# Patient Record
Sex: Female | Born: 1950 | Race: White | Hispanic: No | Marital: Married | Smoking: Former smoker
Health system: Southern US, Community
[De-identification: ages and names within clinical notes are randomized; demographics above are authoritative.]

## PROBLEM LIST (undated history)

## (undated) DIAGNOSIS — M48061 Spinal stenosis, lumbar region without neurogenic claudication: Secondary | ICD-10-CM

## (undated) DIAGNOSIS — M5416 Radiculopathy, lumbar region: Secondary | ICD-10-CM

## (undated) DIAGNOSIS — M5126 Other intervertebral disc displacement, lumbar region: Secondary | ICD-10-CM

## (undated) DIAGNOSIS — M549 Dorsalgia, unspecified: Secondary | ICD-10-CM

## (undated) DIAGNOSIS — M419 Scoliosis, unspecified: Secondary | ICD-10-CM

## (undated) DIAGNOSIS — M79606 Pain in leg, unspecified: Secondary | ICD-10-CM

## (undated) DIAGNOSIS — I499 Cardiac arrhythmia, unspecified: Secondary | ICD-10-CM

## (undated) DIAGNOSIS — I10 Essential (primary) hypertension: Secondary | ICD-10-CM

## (undated) DIAGNOSIS — M8580 Other specified disorders of bone density and structure, unspecified site: Secondary | ICD-10-CM

## (undated) DIAGNOSIS — R29898 Other symptoms and signs involving the musculoskeletal system: Secondary | ICD-10-CM

## (undated) DIAGNOSIS — R000 Tachycardia, unspecified: Secondary | ICD-10-CM

## (undated) HISTORY — DX: Essential (primary) hypertension: I10

## (undated) HISTORY — PX: BILATERAL CARPAL TUNNEL RELEASE: SHX6508

## (undated) HISTORY — DX: Tachycardia, unspecified: R00.0

## (undated) HISTORY — DX: Spinal stenosis, lumbar region without neurogenic claudication: M48.061

## (undated) HISTORY — DX: Radiculopathy, lumbar region: M54.16

## (undated) HISTORY — DX: Other specified disorders of bone density and structure, unspecified site: M85.80

## (undated) HISTORY — DX: Pain in leg, unspecified: M79.606

## (undated) HISTORY — DX: Dorsalgia, unspecified: M54.9

## (undated) HISTORY — DX: Other intervertebral disc displacement, lumbar region: M51.26

## (undated) HISTORY — DX: Other symptoms and signs involving the musculoskeletal system: R29.898

## (undated) HISTORY — DX: Scoliosis, unspecified: M41.9

## (undated) HISTORY — PX: NECK SURGERY: SHX720

---

## 2001-08-18 HISTORY — PX: KNEE SURGERY: SHX244

## 2002-08-18 HISTORY — PX: OTHER SURGICAL HISTORY: SHX169

## 2015-08-19 HISTORY — PX: APPENDECTOMY: SHX54

## 2019-07-19 HISTORY — PX: BACK SURGERY: SHX140

## 2019-09-14 ENCOUNTER — Other Ambulatory Visit: Payer: Self-pay

## 2019-09-14 ENCOUNTER — Other Ambulatory Visit: Payer: Self-pay | Admitting: Neurosurgery

## 2019-09-16 ENCOUNTER — Encounter: Payer: Self-pay | Admitting: Vascular Surgery

## 2019-10-07 ENCOUNTER — Telehealth (HOSPITAL_COMMUNITY): Payer: Self-pay

## 2019-10-07 NOTE — Telephone Encounter (Signed)

## 2019-10-10 ENCOUNTER — Ambulatory Visit (INDEPENDENT_AMBULATORY_CARE_PROVIDER_SITE_OTHER): Payer: Medicare Other | Admitting: Surgery

## 2019-10-10 ENCOUNTER — Other Ambulatory Visit: Payer: Self-pay

## 2019-10-10 ENCOUNTER — Encounter: Payer: Self-pay | Admitting: Surgery

## 2019-10-10 VITALS — BP 128/82 | HR 73 | Temp 97.9°F | Resp 20 | Ht 66.5 in | Wt 114.0 lb

## 2019-10-10 DIAGNOSIS — M479 Spondylosis, unspecified: Secondary | ICD-10-CM | POA: Diagnosis not present

## 2019-10-10 NOTE — H&P (View-Only) (Signed)
[facility]  Patient name: Meagan Garrett MRN: [id number] DOB: 1950-12-08 Sex: female   REQUESTING PROVIDER:    Dr. Venetia Maxon   REASON FOR CONSULT:    ALIF L5-S1  HISTORY OF PRESENT ILLNESS:   Meagan Garrett is a 69 y.o. female, who is referred for evaluation of anterior exposure at the L5-S1 disc base.  The patient has been having back pain since June 2020.  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  Her symptoms have gotten worse.  She also has an associated foot drop on the left.  The patient has had an appendectomy and some form of tubal procedure about 20 years ago.  Those are her only abdominal surgeries.  She has never smoked.  PAST MEDICAL HISTORY    Past Medical History:  Diagnosis Date  . Back pain   . Back pain   . Degenerative lumbar spinal stenosis   . Disc displacement, lumbar   . Hypertension   . Leg pain   . Leg weakness   . Lumbar radiculopathy   . Lumbar scoliosis   . Osteopenia   . Rapid heart rate      FAMILY HISTORY   History reviewed. No pertinent family history.  SOCIAL HISTORY:   Social History   Socioeconomic History  . Marital status: Married    Spouse name: Not on file  . Number of children: Not on file  . Years of education: Not on file  . Highest education level: Not on file  Occupational History  . Occupation: Retired  Tobacco Use  . Smoking status: Never Smoker  . Smokeless tobacco: Never Used  Substance and Sexual Activity  . Alcohol use: Yes    Comment: Occasionally  . Drug use: Yes    Types: Hydrocodone  . Sexual activity: Not on file  Other Topics Concern  . Not on file  Social History Narrative  . Not on file   Social Determinants of Health   Financial Resource Strain:   . Difficulty of Paying Living Expenses: Not on file  Food Insecurity:   . Worried About Programme researcher, broadcasting/film/video in the Last Year: Not on file  . Ran Out of Food in the Last Year:  Not on file  Transportation Needs:   . Lack of Transportation (Medical): Not on file  . Lack of Transportation (Non-Medical): Not on file  Physical Activity:   . Days of Exercise per Week: Not on file  . Minutes of Exercise per Session: Not on file  Stress:   . Feeling of Stress : Not on file  Social Connections:   . Frequency of Communication with Friends and Family: Not on file  . Frequency of Social Gatherings with Friends and Family: Not on file  . Attends Religious Services: Not on file  . Active Member of Clubs or Organizations: Not on file  . Attends Banker Meetings: Not on file  . Marital Status: Not on file  Intimate Partner Violence:   . Fear of Current or Ex-Partner: Not on file  . Emotionally Abused: Not on file  . Physically Abused: Not on file  . Sexually Abused: Not on file    ALLERGIES:    No Known Allergies  CURRENT MEDICATIONS:    Current Outpatient Medications  Medication Sig Dispense Refill  . gabapentin (NEURONTIN) 300 MG capsule Take 300 mg by mouth 3 (three) times daily.    . hydrochlorothiazide (MICROZIDE) 12.5 MG capsule Take  12.5 mg by mouth daily. Patient takes as needed    . HYDROcodone-acetaminophen (NORCO) 10-325 MG tablet     . metoprolol tartrate (LOPRESSOR) 25 MG tablet     . potassium chloride (KLOR-CON) 10 MEQ tablet Take 10 mEq by mouth daily.     No current facility-administered medications for this visit.    REVIEW OF SYSTEMS:   [X]  denotes positive finding, [ ]  denotes negative finding Cardiac  Comments:  Chest pain or chest pressure:    Shortness of breath upon exertion:    Short of breath when lying flat:    Irregular heart rhythm:        Vascular    Pain in calf, thigh, or hip brought on by ambulation:    Pain in feet at night that wakes you up from your sleep:     Blood clot in your veins:    Leg swelling:         Pulmonary    Oxygen at home:    Productive cough:     Wheezing:         Neurologic      Sudden weakness in arms or legs:     Sudden numbness in arms or legs:     Sudden onset of difficulty speaking or slurred speech:    Temporary loss of vision in one eye:     Problems with dizziness:         Gastrointestinal    Blood in stool:      Vomited blood:         Genitourinary    Burning when urinating:     Blood in urine:        Psychiatric    Major depression:         Hematologic    Bleeding problems:    Problems with blood clotting too easily:        Skin    Rashes or ulcers:        Constitutional    Fever or chills:     PHYSICAL EXAM:   There were no vitals filed for this visit.  GENERAL: The patient is a well-nourished female, in no acute distress. The vital signs are documented above. CARDIAC: There is a regular rate and rhythm.  VASCULAR: Palpable dorsalis pedis pulse  PULMONARY: Nonlabored respirations ABDOMEN: Soft and non-tender  MUSCULOSKELETAL: There are no major deformities or cyanosis. NEUROLOGIC: No focal weakness or paresthesias are detected. SKIN: There are no ulcers or rashes noted. PSYCHIATRIC: The patient has a normal affect.  STUDIES:   I have reviewed her outside x-rays including her MRI of the lower back.  ASSESSMENT and PLAN   Degenerative disc disease: I discussed the details of an anterior approach to the L5-S1 disc space.  We discussed the potential risk to the iliac artery and vein as well as the ureter.  We talked about wound healing and incisional issues as well as hernia.  All of her questions were answered.   Leia Alf, MD, FACS [facility] [phone] Pager [phone]

## 2019-10-10 NOTE — Progress Notes (Signed)
[facility]  Patient name: Meagan Garrett MRN: [id number] DOB: 1950-12-08 Sex: female   REQUESTING PROVIDER:    Dr. Venetia Maxon   REASON FOR CONSULT:    ALIF L5-S1  HISTORY OF PRESENT ILLNESS:   Meagan Garrett is a 69 y.o. female, who is referred for evaluation of anterior exposure at the L5-S1 disc base.  The patient has been having back pain since June 2020.  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  Her symptoms have gotten worse.  She also has an associated foot drop on the left.  The patient has had an appendectomy and some form of tubal procedure about 20 years ago.  Those are her only abdominal surgeries.  She has never smoked.  PAST MEDICAL HISTORY    Past Medical History:  Diagnosis Date  . Back pain   . Back pain   . Degenerative lumbar spinal stenosis   . Disc displacement, lumbar   . Hypertension   . Leg pain   . Leg weakness   . Lumbar radiculopathy   . Lumbar scoliosis   . Osteopenia   . Rapid heart rate      FAMILY HISTORY   History reviewed. No pertinent family history.  SOCIAL HISTORY:   Social History   Socioeconomic History  . Marital status: Married    Spouse name: Not on file  . Number of children: Not on file  . Years of education: Not on file  . Highest education level: Not on file  Occupational History  . Occupation: Retired  Tobacco Use  . Smoking status: Never Smoker  . Smokeless tobacco: Never Used  Substance and Sexual Activity  . Alcohol use: Yes    Comment: Occasionally  . Drug use: Yes    Types: Hydrocodone  . Sexual activity: Not on file  Other Topics Concern  . Not on file  Social History Narrative  . Not on file   Social Determinants of Health   Financial Resource Strain:   . Difficulty of Paying Living Expenses: Not on file  Food Insecurity:   . Worried About Programme researcher, broadcasting/film/video in the Last Year: Not on file  . Ran Out of Food in the Last Year:  Not on file  Transportation Needs:   . Lack of Transportation (Medical): Not on file  . Lack of Transportation (Non-Medical): Not on file  Physical Activity:   . Days of Exercise per Week: Not on file  . Minutes of Exercise per Session: Not on file  Stress:   . Feeling of Stress : Not on file  Social Connections:   . Frequency of Communication with Friends and Family: Not on file  . Frequency of Social Gatherings with Friends and Family: Not on file  . Attends Religious Services: Not on file  . Active Member of Clubs or Organizations: Not on file  . Attends Banker Meetings: Not on file  . Marital Status: Not on file  Intimate Partner Violence:   . Fear of Current or Ex-Partner: Not on file  . Emotionally Abused: Not on file  . Physically Abused: Not on file  . Sexually Abused: Not on file    ALLERGIES:    No Known Allergies  CURRENT MEDICATIONS:    Current Outpatient Medications  Medication Sig Dispense Refill  . gabapentin (NEURONTIN) 300 MG capsule Take 300 mg by mouth 3 (three) times daily.    . hydrochlorothiazide (MICROZIDE) 12.5 MG capsule Take  12.5 mg by mouth daily. Patient takes as needed    . HYDROcodone-acetaminophen (NORCO) 10-325 MG tablet     . metoprolol tartrate (LOPRESSOR) 25 MG tablet     . potassium chloride (KLOR-CON) 10 MEQ tablet Take 10 mEq by mouth daily.     No current facility-administered medications for this visit.    REVIEW OF SYSTEMS:   [X] denotes positive finding, [ ] denotes negative finding Cardiac  Comments:  Chest pain or chest pressure:    Shortness of breath upon exertion:    Short of breath when lying flat:    Irregular heart rhythm:        Vascular    Pain in calf, thigh, or hip brought on by ambulation:    Pain in feet at night that wakes you up from your sleep:     Blood clot in your veins:    Leg swelling:         Pulmonary    Oxygen at home:    Productive cough:     Wheezing:         Neurologic      Sudden weakness in arms or legs:     Sudden numbness in arms or legs:     Sudden onset of difficulty speaking or slurred speech:    Temporary loss of vision in one eye:     Problems with dizziness:         Gastrointestinal    Blood in stool:      Vomited blood:         Genitourinary    Burning when urinating:     Blood in urine:        Psychiatric    Major depression:         Hematologic    Bleeding problems:    Problems with blood clotting too easily:        Skin    Rashes or ulcers:        Constitutional    Fever or chills:     PHYSICAL EXAM:   There were no vitals filed for this visit.  GENERAL: The patient is a well-nourished female, in no acute distress. The vital signs are documented above. CARDIAC: There is a regular rate and rhythm.  VASCULAR: Palpable dorsalis pedis pulse  PULMONARY: Nonlabored respirations ABDOMEN: Soft and non-tender  MUSCULOSKELETAL: There are no major deformities or cyanosis. NEUROLOGIC: No focal weakness or paresthesias are detected. SKIN: There are no ulcers or rashes noted. PSYCHIATRIC: The patient has a normal affect.  STUDIES:   I have reviewed her outside x-rays including her MRI of the lower back.  ASSESSMENT and PLAN   Degenerative disc disease: I discussed the details of an anterior approach to the L5-S1 disc space.  We discussed the potential risk to the iliac artery and vein as well as the ureter.  We talked about wound healing and incisional issues as well as hernia.  All of her questions were answered.   Wells Brabham, IV, MD, FACS [facility] Tel [phone] Pager [phone] 

## 2019-10-12 NOTE — H&P (Signed)
Patient ID:   [id number] Patient: Meagan Garrett  Date of Birth: Sep 28, 1950 Visit Type: Office Visit   Date: 09/12/2019 11:15 AM Provider: Danae Orleans. Venetia Maxon MD   This 69 year old female presents for back pain.  HISTORY OF PRESENT ILLNESS:  1.  back pain  Patient returns to discuss surgical options.  The patient is currently complaining of 10/10 pain.  I reviewed her imaging with her and her husband and we discussed options of performing decompression and fusion at the L4-5 level versus correcting her lumbar scoliosis more definitively.  I am concerned that an operation at L4-5 will not completely solved her problem and she will still issues and I have therefore encouraged her to have a more definitive surgical treatment performed.  Based on an [place] of her imaging and severity of her curvature with a 15 degree coronal curve and apex at L1-2, I have recommended L5-S1 ALIF, right sided approach with XL IF L1-2, L2-3, L3-4 L4-5 levels with percutaneous pedicle screw fixation L1 through S1 levels.  This will need to be done on expedited basis because of the patient's persistent severe pain.         Medical/Surgical/Interim History Reviewed, no change.     PAST MEDICAL HISTORY, SURGICAL HISTORY, FAMILY HISTORY, SOCIAL HISTORY AND REVIEW OF SYSTEMS I have reviewed the patient's past medical, surgical, family and social history as well as the comprehensive review of systems as included on the [facility] history form dated 07/13/2019, which I have signed.  Family History:  Reviewed, no changes.    Social History: Reviewed, no changes.   MEDICATIONS: (added, continued or stopped this visit) Started Medication Directions Instruction Stopped  08/16/2019 gabapentin 300 mg capsule take 1 capsule by oral route  TID    08/25/2019 hydrocodone 10 mg-acetaminophen 325 mg tablet take 1 tablet by oral route  every 4 - 6 hours as needed for pain  09/12/2019   09/12/2019 hydrocodone 10 mg-acetaminophen 325 mg tablet take 1 tablet by oral route  every 4 - 6 hours as needed for pain    08/15/2019 Medrol (Pak) 4 mg tablets in a dose pack take by Oral route as directed    07/19/2019 methocarbamol 500 mg tablet take 1 tablet by oral route 3 times every day as needed     metoprolol tartrate 25 mg tablet     08/25/2019 Norco 5 mg-325 mg tablet take 1 tablet by oral route  every 6 hours as needed for pain     potassium chloride ER 10 mEq tablet,extended release        ALLERGIES: Ingredient Reaction Medication Name Comment  NO KNOWN ALLERGIES     No known allergies. Reviewed, no changes.    PHYSICAL EXAM:   Vitals Date Temp F BP Pulse Ht In Wt Lb BMI BSA Pain Score  09/12/2019 98.2 111/72 76 78.5 115 13.12  10/10      IMPRESSION:   Patient is having severe persistent pain.  She is limited in terms activities and ability to stand and walk for any length of time.   I have recommended proceeding with surgical correction of her spinal deformity.  PLAN:  L5-S1 ALIF, L1-2, L2-3, L3-4, L4-5 X lift with percutaneous pedicle screw fixation L1 through S1 levels.  Risks and benefits were discussed in detail with patient and she wishes to proceed with surgery.  She was fitted for a lumbosacral orthosis.  She is given a refill of narcotic analgesics.  Orders: Diagnostic  Procedures: Assessment Procedure  M54.16 Lumbar Spine- AP/Lat  Instruction(s)/Education: Assessment Instruction  Z68.1 Dietary management education, guidance, and counseling  Miscellaneous: Assessment   M41.26 LSO Brace   Completed Orders (this encounter) Order Details Reason Side Interpretation Result Initial Treatment Date Region  Dietary management education, guidance, and counseling Encouraged patient to eat well balanced diet.         Assessment/Plan   # Detail Type Description   1. Assessment Low back pain, unspecified back pain laterality, with sciatica presence  unspecified (M54.5).       2. Assessment Disc displacement, lumbar (M51.26).       3. 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   Plan Orders LSO Brace. Clinical information/comments: [employer].       4. Assessment Degenerative lumbar spinal stenosis (M48.061).       5. Assessment Radiculopathy, lumbar region (M54.16).       6. Assessment Body mass index (BMI) 19.9 or less, adult (Z68.1).   Plan Orders Today's instructions / counseling include(s) Dietary management education, guidance, and counseling. Clinical information/comments: Encouraged patient to eat well balanced diet.         Pain Management Plan Pain Scale: 10/10. Method: Numeric Pain Intensity Scale. Location: back. Onset: 07/13/2019. Duration: varies. Quality: discomforting. Pain management follow-up plan of care: Patient will continue medication management.Marland Kitchen     MEDICATIONS PRESCRIBED TODAY    Rx Quantity Refills  HYDROCODONE-ACETAMINOPHEN 10 mg-325 mg  90 0            Provider:  Marchia Meiers. Vertell Limber MD  09/14/2019 08:21 AM    Dictation edited by: Marchia Meiers. Vertell Limber    CC Providers: Ihor Gully [address] [place]  [address]-   Jannette Cotham Campbell Jr  [address] [place] [address]-               Electronically signed by Marchia Meiers. Vertell Limber MD on 09/14/2019 08:21 AM

## 2019-10-21 NOTE — Progress Notes (Signed)
[facility] - [place], [place] - [address] [place] [address] Phone: [phone] Fax: [phone]      Your procedure is scheduled on Thursday, 10/27/2019.  Report to [facility] Main Entrance "A" at 05:30 A.M., and check in at the Admitting office.  Call this number if you have problems the morning of surgery:  [phone]  Call [phone] if you have any questions prior to your surgery date Monday-Friday 8am-4pm    Remember:  Do not eat or drink after midnight the night before your surgery    Take these medicines the morning of surgery with A SIP OF WATER: Gabapentin (Neurontin) Hydrocodone-acetaminophen (Norco) - if needed Metoprolol tartrate (Lopressor)   7 days prior to surgery STOP taking any Aspirin (unless otherwise instructed by your surgeon), Aleve, Naproxen, Ibuprofen, Motrin, Advil, Goody's, BC's, all herbal medications, fish oil, and all vitamins. (Continue your potassium)    The Morning of Surgery  Do not wear jewelry, make-up or nail polish.  Do not wear lotions, powders, perfumes, or deodorant  Do not shave 48 hours prior to surgery.  Men may shave face and neck.  Do not bring valuables to the hospital.  [facility] is not responsible for any belongings or valuables.  If you are a smoker, DO NOT Smoke 24 hours prior to surgery  If you wear a CPAP at night please bring your mask the morning of surgery   Remember that you must have someone to transport you home after your surgery, and remain with you for 24 hours if you are discharged the same day.   Please bring cases for contacts, glasses, hearing aids, dentures or bridgework because it cannot be worn into surgery.    Leave your suitcase in the car.  After surgery it may be brought to your room.  For patients admitted to the hospital, discharge time will be determined by your treatment team.  Patients discharged the day of surgery will not be allowed to drive  home.    Special instructions:   [place]- Preparing For Surgery  Before surgery, you can play an important role. Because skin is not sterile, your skin needs to be as free of germs as possible. You can reduce the number of germs on your skin by washing with CHG (chlorahexidine gluconate) Soap before surgery.  CHG is an antiseptic cleaner which kills germs and bonds with the skin to continue killing germs even after washing.    Oral Hygiene is also important to reduce your risk of infection.  Remember - BRUSH YOUR TEETH THE MORNING OF SURGERY WITH YOUR REGULAR TOOTHPASTE  Please do not use if you have an allergy to CHG or antibacterial soaps. If your skin becomes reddened/irritated stop using the CHG.  Do not shave (including legs and underarms) for at least 48 hours prior to first CHG shower. It is OK to shave your face.  Please follow these instructions carefully.   1. Shower the NIGHT BEFORE SURGERY and the MORNING OF SURGERY with CHG Soap.   2. If you chose to wash your hair, wash your hair first as usual with your normal shampoo.  3. After you shampoo, rinse your hair and body thoroughly to remove the shampoo.  4. Use CHG as you would any other liquid soap. You can apply CHG directly to the skin and wash gently with a scrungie or a clean washcloth.   5. Apply the CHG Soap to your body ONLY FROM THE  NECK DOWN.  Do not use on open wounds or open sores. Avoid contact with your eyes, ears, mouth and genitals (private parts). Wash Face and genitals (private parts)  with your normal soap.   6. Wash thoroughly, paying special attention to the area where your surgery will be performed.  7. Thoroughly rinse your body with warm water from the neck down.  8. DO NOT shower/wash with your normal soap after using and rinsing off the CHG Soap.  9. Pat yourself dry with a CLEAN TOWEL.  10. Wear CLEAN PAJAMAS to bed the night before surgery, wear comfortable clothes the morning of  surgery  11. Place CLEAN SHEETS on your bed the night of your first shower and DO NOT SLEEP WITH PETS.    Day of Surgery:  Please shower the morning of surgery with the CHG soap Do not apply any deodorants/lotions. Please wear clean clothes to the hospital/surgery center.   Remember to brush your teeth WITH YOUR REGULAR TOOTHPASTE.   Please read over the following fact sheets that you were given.

## 2019-10-24 ENCOUNTER — Other Ambulatory Visit: Payer: Self-pay

## 2019-10-24 ENCOUNTER — Other Ambulatory Visit (HOSPITAL_COMMUNITY)
Admission: RE | Admit: 2019-10-24 | Discharge: 2019-10-24 | Disposition: A | Discharge status: Home / Self Care | Payer: Medicare Other | Source: Ambulatory Visit | Attending: Neurosurgery | Admitting: Neurosurgery

## 2019-10-24 ENCOUNTER — Encounter (HOSPITAL_COMMUNITY): Payer: Self-pay

## 2019-10-24 ENCOUNTER — Encounter (HOSPITAL_COMMUNITY)
Admission: RE | Admit: 2019-10-24 | Discharge: 2019-10-24 | Disposition: A | Discharge status: Home / Self Care | Payer: Medicare Other | Source: Ambulatory Visit | Attending: Neurosurgery | Admitting: Neurosurgery

## 2019-10-24 DIAGNOSIS — Z01812 Encounter for preprocedural laboratory examination: Secondary | ICD-10-CM | POA: Insufficient documentation

## 2019-10-24 DIAGNOSIS — Z20822 Contact with and (suspected) exposure to covid-19: Secondary | ICD-10-CM | POA: Insufficient documentation

## 2019-10-24 HISTORY — DX: Cardiac arrhythmia, unspecified: I49.9

## 2019-10-24 LAB — TYPE AND SCREEN
ABO/RH(D): O NEG
Antibody Screen: NEGATIVE

## 2019-10-24 LAB — SARS CORONAVIRUS 2 (TAT 6-24 HRS): SARS Coronavirus 2: NEGATIVE

## 2019-10-24 LAB — BASIC METABOLIC PANEL
Anion gap: 10 (ref 5–15)
BUN: 19 mg/dL (ref 8–23)
CO2: 31 mmol/L (ref 22–32)
Calcium: 10.4 mg/dL — ABNORMAL HIGH (ref 8.9–10.3)
Chloride: 98 mmol/L (ref 98–111)
Creatinine, Ser: 0.82 mg/dL (ref 0.44–1.00)
GFR calc Af Amer: >60 mL/min (ref >60)
GFR calc non Af Amer: >60 mL/min (ref >60)
Glucose, Bld: 80 mg/dL (ref 70–99)
Potassium: 3.6 mmol/L (ref 3.5–5.1)
Sodium: 139 mmol/L (ref 135–145)

## 2019-10-24 LAB — CBC
HCT: 40.6 % (ref 36.0–46.0)
Hemoglobin: 13.7 g/dL (ref 12.0–15.0)
MCH: 34.8 pg — ABNORMAL HIGH (ref 26.0–34.0)
MCHC: 33.7 g/dL (ref 30.0–36.0)
MCV: 103 fL — ABNORMAL HIGH (ref 80.0–100.0)
Platelets: 414 10*3/uL — ABNORMAL HIGH (ref 150–400)
RBC: 3.94 MIL/uL (ref 3.87–5.11)
RDW: 14.8 % (ref 11.5–15.5)
WBC: 8.4 10*3/uL (ref 4.0–10.5)
nRBC: 0 % (ref 0.0–0.2)

## 2019-10-24 LAB — SURGICAL PCR SCREEN
MRSA, PCR: NEGATIVE
Staphylococcus aureus: NEGATIVE

## 2019-10-24 LAB — ABO/RH: ABO/RH(D): O NEG

## 2019-10-24 NOTE — Progress Notes (Signed)
PCP - Dr. Damaris Schooner Cardiologist - Dr. Daryel November  PPM/ICD - n/a Device Orders -  Rep Notified -   Chest x-ray - n/a EKG - patient states she has had EKG within the past year, tracing requested from Dr. Daryel November Stress Test - patient denies ECHO - 2018-2019 when she first started having problems with tachycardia Cardiac Cath - patient denies  Sleep Study - patient denies CPAP -   Fasting Blood Sugar - n/a Checks Blood Sugar _____ times a day  Blood Thinner Instructions: n/a Aspirin Instructions:  ERAS Protcol - n/a PRE-SURGERY Ensure or G2-   COVID TEST- scheduled after PAT appointment 10/24/19   Anesthesia review: yes, hx of Echo  Patient denies shortness of breath, fever, cough and chest pain at PAT appointment   All instructions explained to the patient, with a verbal understanding of the material. Patient agrees to go over the instructions while at home for a better understanding. Patient also instructed to self quarantine after being tested for COVID-19. The opportunity to ask questions was provided.

## 2019-10-25 NOTE — Progress Notes (Addendum)
Anesthesia Chart Review:  Case: [id number] Date/Time: 10/27/19 0715   Procedures:      Lumbar 5 Sacral 1 Anterior lumbar interbody fusion (N/A ) - Lumbar 5 Sacral 1 Anterior lumbar interbody fusion     Right Lumbar 1-2 Lumbar 2-3 Lumbar 3-4 Lumbar 4-5 Anterolateral lumbar fusion with percutaneous pedicle screws Lumbar 1 to Sacral 1 (Right ) - Right Lumbar 1-2 Lumbar 2-3 Lumbar 3-4 Lumbar 4-5 Anterolateral lumbar fusion with percutaneous pedicle screws Lumbar 1 to Sacral 1     LUMBAR PERCUTANEOUS PEDICLE SCREW 4 LEVEL (N/A )     ABDOMINAL EXPOSURE (N/A )   Anesthesia type: General   Pre-op diagnosis: Degenerative Lumbar spinal stenosis   Location: [facility] OR ROOM 21 / [place] [facility]   Surgeons: Erline Levine, MD; Serafina Mitchell, MD      DISCUSSION: Patient is a 69 year old female scheduled for the above procedure.  History includes former smoker, tachycardia, HTN, back/leg pain, C5-6 ACDF (2004), back surgery (07/2019).   Reportedly, she was evaluated by cardiologist Dr. Sabra Heck within the past year. Records requested but are pending. She denied SOB, cough, fever, chest pain at PAT RN visit.   10/24/19 COVID-19 test negative. Chart will be left for follow-up cardiology records.  ADDENDUM 10/26/19 10:04 AM: Cardiology records received. She was first evaluated on 11/02/18 for palpitations and syncope/presyncope. He had a Holter monitor that showed SR with asymptomatic PACs. Echo showed normal LVEF, no AS or significant valvular abnormalities. Last visit was on 05/03/19 with Gerrie Nordmann, NP. Patient denied CV symptoms. No new CV testing recommended at that time. Follow-up around 8 months planned. Based on available information, I would anticipate that she may proceed as planned if no acute changes.     VS: BP 127/77   Pulse 71   Temp 36.9 C (Oral)   Resp 20   Ht 5' 6.5" (1.689 m)   Wt 51.6 kg   SpO2 100%   BMI 18.08 kg/m    PROVIDERS: Dhivianathan, Candida Peeling, MD is PCP [facility]) Orpah Greek, MD  is cardiologist ([facility])   LABS: Labs reviewed: Acceptable for surgery. (all labs ordered are listed, but only abnormal results are displayed)  Labs Reviewed  BASIC METABOLIC PANEL - Abnormal; Notable for the following components:      Result Value   Calcium 10.4 (*)    All other components within normal limits  CBC - Abnormal; Notable for the following components:   MCV 103.0 (*)    MCH 34.8 (*)    Platelets 414 (*)    All other components within normal limits  SURGICAL PCR SCREEN  TYPE AND SCREEN  ABO/RH    EKG: EKG 11/02/18 ([facility]): Normal EKG    CV: Echo 12/17/18 ([facility]):  Conclusions: Normal left ventricular systolic function.  LVEF 55 to 60%. Normal right ventricular function. Normal LV wall thickness. Normal diastolic function. Normal chamber dimensions. Valvular Doppler imaging demonstrates trace tricuspid regurgitation. Normal estimated PA systolic pressure. No pericardial effusion. No other significant findings.   According to 05/03/19 note by Gerrie Nordmann, NP, "The patient has undergone the following recent studies: Holter monitor.  The Holter monitor revealed normal sinus rhythm without significant atrial or ventricular arrhythmia and asymptomatic PACs."   Denied prior stress test or cardiac cath.   Past Medical History:  Diagnosis Date  . Back pain   . Back pain   . Degenerative lumbar spinal stenosis   . Disc displacement,  lumbar   . Dysrhythmia    tachycardia  . Hypertension   . Leg pain   . Leg weakness   . Lumbar radiculopathy   . Lumbar scoliosis   . Osteopenia   . Rapid heart rate     Past Surgical History:  Procedure Laterality Date  . ACDF C5-6  2004   Dr. Lance Coon   . APPENDECTOMY  2017  . BACK SURGERY  07/2019   Dr. Venetia Maxon at [facility]  . BILATERAL CARPAL TUNNEL RELEASE    . KNEE SURGERY Left 2003   Dr. Orvan Falconer  .  NECK SURGERY      MEDICATIONS: . calcium carbonate (OSCAL) 1500 (600 Ca) MG TABS tablet  . Cyanocobalamin (B-12) 2500 MCG TABS  . gabapentin (NEURONTIN) 300 MG capsule  . hydrochlorothiazide (MICROZIDE) 12.5 MG capsule  . HYDROcodone-acetaminophen (NORCO) 10-325 MG tablet  . metoprolol tartrate (LOPRESSOR) 25 MG tablet  . Multiple Vitamin (MULTIVITAMIN WITH MINERALS) TABS tablet  . potassium chloride (KLOR-CON) 10 MEQ tablet   No current facility-administered medications for this encounter.    Shonna Chock, PA-C Surgical Short Stay/Anesthesiology [facility] Phone [phone] [facility] Phone [phone] 10/25/2019 2:00 PM

## 2019-10-26 NOTE — Anesthesia Preprocedure Evaluation (Addendum)
Anesthesia Evaluation  Patient identified by MRN, date of birth, ID band Patient awake    Reviewed: Allergy & Precautions, NPO status , Patient's Chart, lab work & pertinent test results  Airway Mallampati: II  TM Distance: >3 FB Neck ROM: Full    Dental  (+) Teeth Intact, Dental Advisory Given   Pulmonary former smoker,    breath sounds clear to auscultation       Cardiovascular hypertension,  Rhythm:Regular Rate:Normal     Neuro/Psych    GI/Hepatic   Endo/Other    Renal/GU      Musculoskeletal   Abdominal   Peds  Hematology   Anesthesia Other Findings   Reproductive/Obstetrics                            Anesthesia Physical Anesthesia Plan  ASA: II  Anesthesia Plan: General   Post-op Pain Management:    Induction: Intravenous  PONV Risk Score and Plan: Ondansetron and Dexamethasone  Airway Management Planned: Oral ETT  Additional Equipment:   Intra-op Plan:   Post-operative Plan: Extubation in OR  Informed Consent: I have reviewed the patients History and Physical, chart, labs and discussed the procedure including the risks, benefits and alternatives for the proposed anesthesia with the patient or authorized representative who has indicated his/her understanding and acceptance.     Dental advisory given  Plan Discussed with: Anesthesiologist and CRNA  Anesthesia Plan Comments: (PAT note written by Shonna Chock, PA-C. )      Anesthesia Quick Evaluation

## 2019-10-27 ENCOUNTER — Encounter (HOSPITAL_COMMUNITY)
Admission: RE | Disposition: A | Discharge status: Home Health | Payer: Self-pay | Source: Home / Self Care | Attending: Neurosurgery

## 2019-10-27 ENCOUNTER — Inpatient Hospital Stay (HOSPITAL_COMMUNITY): Payer: Medicare Other

## 2019-10-27 ENCOUNTER — Inpatient Hospital Stay (HOSPITAL_COMMUNITY): Payer: Medicare Other | Admitting: Vascular Surgery

## 2019-10-27 ENCOUNTER — Encounter (HOSPITAL_COMMUNITY): Payer: Self-pay | Admitting: Neurosurgery

## 2019-10-27 ENCOUNTER — Inpatient Hospital Stay (HOSPITAL_COMMUNITY)
Admission: RE | Admit: 2019-10-27 | Discharge: 2019-11-02 | DRG: 454 | Disposition: A | Discharge status: Home Health | Payer: Medicare Other | Attending: Neurosurgery | Admitting: Neurosurgery

## 2019-10-27 ENCOUNTER — Other Ambulatory Visit: Payer: Self-pay

## 2019-10-27 DIAGNOSIS — Z419 Encounter for procedure for purposes other than remedying health state, unspecified: Secondary | ICD-10-CM

## 2019-10-27 DIAGNOSIS — M21372 Foot drop, left foot: Secondary | ICD-10-CM | POA: Diagnosis present

## 2019-10-27 DIAGNOSIS — Z87891 Personal history of nicotine dependence: Secondary | ICD-10-CM

## 2019-10-27 DIAGNOSIS — M419 Scoliosis, unspecified: Secondary | ICD-10-CM | POA: Diagnosis present

## 2019-10-27 DIAGNOSIS — M5417 Radiculopathy, lumbosacral region: Secondary | ICD-10-CM

## 2019-10-27 DIAGNOSIS — Z20822 Contact with and (suspected) exposure to covid-19: Secondary | ICD-10-CM | POA: Diagnosis present

## 2019-10-27 DIAGNOSIS — M5116 Intervertebral disc disorders with radiculopathy, lumbar region: Secondary | ICD-10-CM | POA: Diagnosis present

## 2019-10-27 DIAGNOSIS — M48061 Spinal stenosis, lumbar region without neurogenic claudication: Secondary | ICD-10-CM | POA: Diagnosis present

## 2019-10-27 DIAGNOSIS — M8580 Other specified disorders of bone density and structure, unspecified site: Secondary | ICD-10-CM | POA: Diagnosis present

## 2019-10-27 DIAGNOSIS — Z79899 Other long term (current) drug therapy: Secondary | ICD-10-CM

## 2019-10-27 DIAGNOSIS — M4126 Other idiopathic scoliosis, lumbar region: Secondary | ICD-10-CM | POA: Diagnosis present

## 2019-10-27 DIAGNOSIS — M4316 Spondylolisthesis, lumbar region: Secondary | ICD-10-CM | POA: Diagnosis present

## 2019-10-27 DIAGNOSIS — I10 Essential (primary) hypertension: Secondary | ICD-10-CM | POA: Diagnosis present

## 2019-10-27 DIAGNOSIS — K567 Ileus, unspecified: Secondary | ICD-10-CM | POA: Diagnosis not present

## 2019-10-27 DIAGNOSIS — M4317 Spondylolisthesis, lumbosacral region: Secondary | ICD-10-CM | POA: Diagnosis not present

## 2019-10-27 HISTORY — PX: LUMBAR PERCUTANEOUS PEDICLE SCREW 4 LEVEL: SHX6318

## 2019-10-27 HISTORY — PX: ABDOMINAL EXPOSURE: SHX5708

## 2019-10-27 HISTORY — PX: ANTERIOR LUMBAR FUSION: SHX1170

## 2019-10-27 HISTORY — PX: ANTERIOR LATERAL LUMBAR FUSION 4 LEVELS: SHX5552

## 2019-10-27 LAB — POCT I-STAT 7, (LYTES, BLD GAS, ICA,H+H)
Acid-base deficit: 4 mmol/L — ABNORMAL HIGH (ref 0.0–2.0)
Acid-base deficit: 3 mmol/L — ABNORMAL HIGH (ref 0.0–2.0)
Bicarbonate: 22.8 mmol/L (ref 20.0–28.0)
Bicarbonate: 24.9 mmol/L (ref 20.0–28.0)
Calcium, Ion: 1.05 mmol/L — ABNORMAL LOW (ref 1.15–1.40)
Calcium, Ion: 1.17 mmol/L (ref 1.15–1.40)
HCT: 29 % — ABNORMAL LOW (ref 36.0–46.0)
HCT: 37 % (ref 36.0–46.0)
Hemoglobin: 9.9 g/dL — ABNORMAL LOW (ref 12.0–15.0)
Hemoglobin: 12.6 g/dL (ref 12.0–15.0)
O2 Saturation: 100 %
O2 Saturation: 100 %
Potassium: 2.7 mmol/L — CL (ref 3.5–5.1)
Potassium: 3.4 mmol/L — ABNORMAL LOW (ref 3.5–5.1)
Sodium: 143 mmol/L (ref 135–145)
Sodium: 139 mmol/L (ref 135–145)
TCO2: 24 mmol/L (ref 22–32)
TCO2: 26 mmol/L (ref 22–32)
pCO2 arterial: 46.5 mmHg (ref 32.0–48.0)
pCO2 arterial: 53.1 mmHg — ABNORMAL HIGH (ref 32.0–48.0)
pH, Arterial: 7.298 — ABNORMAL LOW (ref 7.350–7.450)
pH, Arterial: 7.279 — ABNORMAL LOW (ref 7.350–7.450)
pO2, Arterial: 261 mmHg — ABNORMAL HIGH (ref 83.0–108.0)
pO2, Arterial: 211 mmHg — ABNORMAL HIGH (ref 83.0–108.0)

## 2019-10-27 SURGERY — ANTERIOR LUMBAR FUSION 1 LEVEL
Anesthesia: General | Site: Spine Lumbar | Laterality: Right

## 2019-10-27 MED ORDER — SODIUM CHLORIDE 0.9% FLUSH: 3.0000 mL | INTRAVENOUS | Status: DC | PRN

## 2019-10-27 MED ORDER — ACETAMINOPHEN 650 MG RE SUPP: 650.0000 mg | RECTAL | Status: DC | PRN

## 2019-10-27 MED ORDER — OXYCODONE HCL 5 MG/5ML PO SOLN: 5.0000 mg | Freq: Once | ORAL | Status: DC | PRN

## 2019-10-27 MED ORDER — HYDROMORPHONE HCL 1 MG/ML IJ SOLN
0.5000 mg | INTRAMUSCULAR | Status: DC | PRN
  Administered 2019-10-27 – 2019-11-01 (×10): 0.5 mg via INTRAVENOUS
  Filled 2019-10-27: qty 1
  Filled 2019-10-27 (×2): qty 0.5
  Filled 2019-10-27 (×2): qty 1
  Filled 2019-10-27: qty 0.5
  Filled 2019-10-27: qty 1
  Filled 2019-10-27: qty 0.5
  Filled 2019-10-27: qty 1

## 2019-10-27 MED ORDER — OXYCODONE HCL 5 MG PO TABS: 5.0000 mg | ORAL_TABLET | ORAL | Status: DC | PRN

## 2019-10-27 MED ORDER — ONDANSETRON HCL 4 MG PO TABS: 4.0000 mg | ORAL_TABLET | Freq: Four times a day (QID) | ORAL | Status: DC | PRN

## 2019-10-27 MED ORDER — CEFAZOLIN SODIUM-DEXTROSE 2-4 GM/100ML-% IV SOLN
2.0000 g | Freq: Three times a day (TID) | INTRAVENOUS | Status: AC
  Administered 2019-10-27 – 2019-10-28 (×2): 2 g via INTRAVENOUS
  Filled 2019-10-27 (×2): qty 100

## 2019-10-27 MED ORDER — ALBUMIN HUMAN 5 % IV SOLN: INTRAVENOUS | Status: DC | PRN

## 2019-10-27 MED ORDER — ONDANSETRON HCL 4 MG/2ML IJ SOLN: 4.0000 mg | Freq: Once | INTRAMUSCULAR | Status: DC | PRN

## 2019-10-27 MED ORDER — ADULT MULTIVITAMIN W/MINERALS CH
1.0000 | ORAL_TABLET | Freq: Every day | ORAL | Status: DC
  Administered 2019-10-28 – 2019-11-02 (×6): 1 via ORAL
  Filled 2019-10-27 (×6): qty 1

## 2019-10-27 MED ORDER — HYDROCHLOROTHIAZIDE 12.5 MG PO CAPS: 12.5000 mg | ORAL_CAPSULE | Freq: Every day | ORAL | Status: DC | PRN

## 2019-10-27 MED ORDER — ONDANSETRON HCL 4 MG/2ML IJ SOLN
4.0000 mg | Freq: Four times a day (QID) | INTRAMUSCULAR | Status: DC | PRN
  Administered 2019-10-31: 4 mg via INTRAVENOUS
  Filled 2019-10-27: qty 2

## 2019-10-27 MED ORDER — CHLORHEXIDINE GLUCONATE 4 % EX LIQD: 60.0000 mL | Freq: Once | CUTANEOUS | Status: DC

## 2019-10-27 MED ORDER — KETAMINE HCL 10 MG/ML IJ SOLN
INTRAMUSCULAR | Status: DC | PRN
  Administered 2019-10-27 (×5): 10 mg via INTRAVENOUS

## 2019-10-27 MED ORDER — KETAMINE HCL 50 MG/5ML IJ SOSY
PREFILLED_SYRINGE | INTRAMUSCULAR | Status: AC
  Filled 2019-10-27: qty 5

## 2019-10-27 MED ORDER — CALCIUM CARBONATE 1250 (500 CA) MG PO TABS
600.0000 mg | ORAL_TABLET | Freq: Two times a day (BID) | ORAL | Status: DC
  Filled 2019-10-27: qty 1

## 2019-10-27 MED ORDER — KCL IN DEXTROSE-NACL 20-5-0.45 MEQ/L-%-% IV SOLN
INTRAVENOUS | Status: DC
  Filled 2019-10-27: qty 1000

## 2019-10-27 MED ORDER — CALCIUM CARBONATE 1250 (500 CA) MG PO TABS
1.0000 | ORAL_TABLET | Freq: Two times a day (BID) | ORAL | Status: DC
  Administered 2019-10-28 – 2019-11-02 (×11): 500 mg via ORAL
  Filled 2019-10-27 (×12): qty 1

## 2019-10-27 MED ORDER — LIDOCAINE 2% (20 MG/ML) 5 ML SYRINGE
INTRAMUSCULAR | Status: AC
  Filled 2019-10-27: qty 5

## 2019-10-27 MED ORDER — DOCUSATE SODIUM 100 MG PO CAPS
100.0000 mg | ORAL_CAPSULE | Freq: Two times a day (BID) | ORAL | Status: DC
  Administered 2019-10-27 – 2019-11-02 (×12): 100 mg via ORAL
  Filled 2019-10-27 (×12): qty 1

## 2019-10-27 MED ORDER — ZOLPIDEM TARTRATE 5 MG PO TABS: 5.0000 mg | ORAL_TABLET | Freq: Every evening | ORAL | Status: DC | PRN

## 2019-10-27 MED ORDER — FENTANYL CITRATE (PF) 100 MCG/2ML IJ SOLN
INTRAMUSCULAR | Status: AC
  Filled 2019-10-27: qty 2

## 2019-10-27 MED ORDER — ALUM & MAG HYDROXIDE-SIMETH 200-200-20 MG/5ML PO SUSP
30.0000 mL | Freq: Four times a day (QID) | ORAL | Status: DC | PRN
  Administered 2019-10-28: 30 mL via ORAL
  Filled 2019-10-27: qty 30

## 2019-10-27 MED ORDER — FENTANYL CITRATE (PF) 250 MCG/5ML IJ SOLN
INTRAMUSCULAR | Status: AC
  Filled 2019-10-27: qty 5

## 2019-10-27 MED ORDER — 0.9 % SODIUM CHLORIDE (POUR BTL) OPTIME
TOPICAL | Status: DC | PRN
  Administered 2019-10-27 (×3): 1000 mL

## 2019-10-27 MED ORDER — SODIUM CHLORIDE 0.9% FLUSH
3.0000 mL | Freq: Two times a day (BID) | INTRAVENOUS | Status: DC
  Administered 2019-10-28: 3 mL via INTRAVENOUS

## 2019-10-27 MED ORDER — PROPOFOL 10 MG/ML IV BOLUS
INTRAVENOUS | Status: AC
  Filled 2019-10-27: qty 40

## 2019-10-27 MED ORDER — SUFENTANIL CITRATE 250 MCG/5ML IV SOLN
0.2500 ug/kg/h | INTRAVENOUS | Status: AC
  Administered 2019-10-27: .3 ug/kg/h via INTRAVENOUS
  Filled 2019-10-27: qty 5

## 2019-10-27 MED ORDER — BISACODYL 10 MG RE SUPP: 10.0000 mg | Freq: Every day | RECTAL | Status: DC | PRN

## 2019-10-27 MED ORDER — PHENYLEPHRINE HCL-NACL 10-0.9 MG/250ML-% IV SOLN
INTRAVENOUS | Status: DC | PRN
  Administered 2019-10-27: 55 ug/min via INTRAVENOUS
  Administered 2019-10-27: 15 ug/min via INTRAVENOUS

## 2019-10-27 MED ORDER — BUPIVACAINE LIPOSOME 1.3 % IJ SUSP
20.0000 mL | INTRAMUSCULAR | Status: AC
  Administered 2019-10-27: 16:00:00 20 mL
  Filled 2019-10-27: qty 20

## 2019-10-27 MED ORDER — CEFAZOLIN SODIUM-DEXTROSE 2-4 GM/100ML-% IV SOLN
2.0000 g | INTRAVENOUS | Status: AC
  Administered 2019-10-27 (×2): 2 g via INTRAVENOUS
  Filled 2019-10-27: qty 100

## 2019-10-27 MED ORDER — HYDROCODONE-ACETAMINOPHEN 10-325 MG PO TABS
1.0000 | ORAL_TABLET | ORAL | Status: DC | PRN
  Administered 2019-10-27 – 2019-11-01 (×14): 1 via ORAL
  Filled 2019-10-27 (×14): qty 1

## 2019-10-27 MED ORDER — SODIUM CHLORIDE (PF) 0.9 % IJ SOLN
INTRAMUSCULAR | Status: AC
  Filled 2019-10-27: qty 10

## 2019-10-27 MED ORDER — SODIUM CHLORIDE 0.9 % IV SOLN: 250.0000 mL | INTRAVENOUS | Status: DC

## 2019-10-27 MED ORDER — LACTATED RINGERS IV SOLN: INTRAVENOUS | Status: DC | PRN

## 2019-10-27 MED ORDER — FLEET ENEMA 7-19 GM/118ML RE ENEM: 1.0000 | ENEMA | Freq: Once | RECTAL | Status: DC | PRN

## 2019-10-27 MED ORDER — METHOCARBAMOL 500 MG PO TABS
ORAL_TABLET | ORAL | Status: AC
  Filled 2019-10-27: qty 1

## 2019-10-27 MED ORDER — LIDOCAINE-EPINEPHRINE 1 %-1:100000 IJ SOLN
INTRAMUSCULAR | Status: DC | PRN
  Administered 2019-10-27: 17 mL

## 2019-10-27 MED ORDER — GABAPENTIN 300 MG PO CAPS
300.0000 mg | ORAL_CAPSULE | Freq: Three times a day (TID) | ORAL | Status: DC
  Administered 2019-10-27 – 2019-11-02 (×17): 300 mg via ORAL
  Filled 2019-10-27 (×17): qty 1

## 2019-10-27 MED ORDER — BUPIVACAINE HCL (PF) 0.5 % IJ SOLN
INTRAMUSCULAR | Status: AC
  Filled 2019-10-27: qty 30

## 2019-10-27 MED ORDER — EPHEDRINE 5 MG/ML INJ
INTRAVENOUS | Status: AC
  Filled 2019-10-27: qty 10

## 2019-10-27 MED ORDER — PANTOPRAZOLE SODIUM 40 MG IV SOLR
40.0000 mg | Freq: Every day | INTRAVENOUS | Status: DC
  Administered 2019-10-27: 40 mg via INTRAVENOUS
  Filled 2019-10-27: qty 40

## 2019-10-27 MED ORDER — PHENOL 1.4 % MT LIQD: 1.0000 | OROMUCOSAL | Status: DC | PRN

## 2019-10-27 MED ORDER — GABAPENTIN 300 MG PO CAPS: 300.0000 mg | ORAL_CAPSULE | Freq: Three times a day (TID) | ORAL | Status: DC

## 2019-10-27 MED ORDER — CHLORHEXIDINE GLUCONATE CLOTH 2 % EX PADS: 6.0000 | MEDICATED_PAD | Freq: Once | CUTANEOUS | Status: DC

## 2019-10-27 MED ORDER — PROPOFOL 10 MG/ML IV BOLUS
INTRAVENOUS | Status: DC | PRN
  Administered 2019-10-27: 140 mg via INTRAVENOUS

## 2019-10-27 MED ORDER — MIDAZOLAM HCL 2 MG/2ML IJ SOLN
INTRAMUSCULAR | Status: AC
  Filled 2019-10-27: qty 2

## 2019-10-27 MED ORDER — LIDOCAINE-EPINEPHRINE 1 %-1:100000 IJ SOLN
INTRAMUSCULAR | Status: AC
  Filled 2019-10-27: qty 1

## 2019-10-27 MED ORDER — BUPIVACAINE HCL (PF) 0.5 % IJ SOLN
INTRAMUSCULAR | Status: DC | PRN
  Administered 2019-10-27: 17 mL

## 2019-10-27 MED ORDER — METHOCARBAMOL 1000 MG/10ML IJ SOLN
500.0000 mg | Freq: Four times a day (QID) | INTRAVENOUS | Status: DC | PRN
  Filled 2019-10-27: qty 5

## 2019-10-27 MED ORDER — SODIUM CHLORIDE (PF) 0.9 % IJ SOLN
INTRAMUSCULAR | Status: DC | PRN
  Administered 2019-10-27: 20 mL

## 2019-10-27 MED ORDER — VITAMIN B-12 1000 MCG PO TABS
2500.0000 ug | ORAL_TABLET | Freq: Every day | ORAL | Status: DC
  Administered 2019-10-28 – 2019-11-02 (×6): 2500 ug via ORAL
  Filled 2019-10-27: qty 3
  Filled 2019-10-27: qty 2.5
  Filled 2019-10-27: qty 3
  Filled 2019-10-27: qty 2.5
  Filled 2019-10-27 (×3): qty 3

## 2019-10-27 MED ORDER — EPHEDRINE SULFATE-NACL 50-0.9 MG/10ML-% IV SOSY
PREFILLED_SYRINGE | INTRAVENOUS | Status: DC | PRN
  Administered 2019-10-27: 10 mg via INTRAVENOUS
  Administered 2019-10-27 (×6): 5 mg via INTRAVENOUS

## 2019-10-27 MED ORDER — HYDROMORPHONE HCL 1 MG/ML IJ SOLN
INTRAMUSCULAR | Status: AC
  Filled 2019-10-27: qty 0.5

## 2019-10-27 MED ORDER — DEXAMETHASONE SODIUM PHOSPHATE 10 MG/ML IJ SOLN
INTRAMUSCULAR | Status: AC
  Filled 2019-10-27: qty 1

## 2019-10-27 MED ORDER — METOPROLOL TARTRATE 12.5 MG HALF TABLET: 12.5000 mg | ORAL_TABLET | ORAL | Status: DC

## 2019-10-27 MED ORDER — METOPROLOL TARTRATE 25 MG PO TABS
25.0000 mg | ORAL_TABLET | Freq: Every day | ORAL | Status: DC
  Administered 2019-10-27 – 2019-11-01 (×6): 25 mg via ORAL
  Filled 2019-10-27 (×6): qty 1

## 2019-10-27 MED ORDER — OXYCODONE HCL 5 MG PO TABS: 5.0000 mg | ORAL_TABLET | Freq: Once | ORAL | Status: DC | PRN

## 2019-10-27 MED ORDER — ROCURONIUM BROMIDE 10 MG/ML (PF) SYRINGE
PREFILLED_SYRINGE | INTRAVENOUS | Status: AC
  Filled 2019-10-27: qty 10

## 2019-10-27 MED ORDER — METHOCARBAMOL 500 MG PO TABS
500.0000 mg | ORAL_TABLET | Freq: Four times a day (QID) | ORAL | Status: DC | PRN
  Administered 2019-10-27 – 2019-11-01 (×13): 500 mg via ORAL
  Filled 2019-10-27 (×14): qty 1

## 2019-10-27 MED ORDER — ACETAMINOPHEN 325 MG PO TABS: 650.0000 mg | ORAL_TABLET | ORAL | Status: DC | PRN

## 2019-10-27 MED ORDER — METOPROLOL TARTRATE 12.5 MG HALF TABLET
12.5000 mg | ORAL_TABLET | Freq: Every day | ORAL | Status: DC
  Administered 2019-10-28 – 2019-11-02 (×6): 12.5 mg via ORAL
  Filled 2019-10-27 (×6): qty 1

## 2019-10-27 MED ORDER — SUGAMMADEX SODIUM 200 MG/2ML IV SOLN
INTRAVENOUS | Status: DC | PRN
  Administered 2019-10-27: 100 mg via INTRAVENOUS

## 2019-10-27 MED ORDER — THROMBIN 5000 UNITS EX SOLR
CUTANEOUS | Status: AC
  Filled 2019-10-27: qty 10000

## 2019-10-27 MED ORDER — FENTANYL CITRATE (PF) 250 MCG/5ML IJ SOLN
INTRAMUSCULAR | Status: DC | PRN
  Administered 2019-10-27 (×5): 50 ug via INTRAVENOUS

## 2019-10-27 MED ORDER — HYDROCODONE-ACETAMINOPHEN 5-325 MG PO TABS
2.0000 | ORAL_TABLET | ORAL | Status: DC | PRN
  Administered 2019-10-27 – 2019-10-28 (×2): 2 via ORAL
  Filled 2019-10-27 (×2): qty 2

## 2019-10-27 MED ORDER — MENTHOL 3 MG MT LOZG: 1.0000 | LOZENGE | OROMUCOSAL | Status: DC | PRN

## 2019-10-27 MED ORDER — DEXAMETHASONE SODIUM PHOSPHATE 10 MG/ML IJ SOLN
INTRAMUSCULAR | Status: DC | PRN
  Administered 2019-10-27: 4 mg via INTRAVENOUS

## 2019-10-27 MED ORDER — THROMBIN 5000 UNITS EX SOLR
OROMUCOSAL | Status: DC | PRN
  Administered 2019-10-27: 5 mL via TOPICAL

## 2019-10-27 MED ORDER — POLYETHYLENE GLYCOL 3350 17 G PO PACK
17.0000 g | PACK | Freq: Every day | ORAL | Status: DC | PRN
  Administered 2019-10-27: 17 g via ORAL
  Filled 2019-10-27: qty 1

## 2019-10-27 MED ORDER — POTASSIUM CHLORIDE ER 10 MEQ PO TBCR
10.0000 meq | EXTENDED_RELEASE_TABLET | Freq: Every day | ORAL | Status: DC
  Administered 2019-10-28 – 2019-11-02 (×6): 10 meq via ORAL
  Filled 2019-10-27 (×12): qty 1

## 2019-10-27 MED ORDER — ROCURONIUM BROMIDE 10 MG/ML (PF) SYRINGE
PREFILLED_SYRINGE | INTRAVENOUS | Status: DC | PRN
  Administered 2019-10-27 (×2): 50 mg via INTRAVENOUS
  Administered 2019-10-27: 20 mg via INTRAVENOUS

## 2019-10-27 MED ORDER — CEFAZOLIN SODIUM 1 G IJ SOLR
INTRAMUSCULAR | Status: AC
  Filled 2019-10-27: qty 20

## 2019-10-27 MED ORDER — MIDAZOLAM HCL 5 MG/5ML IJ SOLN
INTRAMUSCULAR | Status: DC | PRN
  Administered 2019-10-27: 2 mg via INTRAVENOUS

## 2019-10-27 MED ORDER — ONDANSETRON HCL 4 MG/2ML IJ SOLN
INTRAMUSCULAR | Status: DC | PRN
  Administered 2019-10-27: 4 mg via INTRAVENOUS

## 2019-10-27 MED ORDER — FENTANYL CITRATE (PF) 100 MCG/2ML IJ SOLN
25.0000 ug | INTRAMUSCULAR | Status: DC | PRN
  Administered 2019-10-27 (×3): 50 ug via INTRAVENOUS

## 2019-10-27 SURGICAL SUPPLY — 137 items
APPLIER CLIP 11 MED OPEN (CLIP) ×5
BASE TI IMPLANT 8X38X28 15DEG (Neuro Prosthesis/Implant) ×2 IMPLANT
BASKET BONE COLLECTION (BASKET) IMPLANT
BLADE CLIPPER SURG (BLADE) ×2 IMPLANT
BOLT BASE TI 5X17.5 VARIABLE (Bolt) ×6 IMPLANT
BUR BARREL STRAIGHT FLUTE 4.0 (BURR) IMPLANT
CAGE MODULUS XL 10X18X50 - 10 (Cage) ×2 IMPLANT
CANISTER SUCT 3000ML PPV (MISCELLANEOUS) ×3 IMPLANT
CARTRIDGE OIL MAESTRO DRILL (MISCELLANEOUS) ×6 IMPLANT
CLIP APPLIE 11 MED OPEN (CLIP) ×6 IMPLANT
CLIP NEUROVISION LG (CLIP) ×2 IMPLANT
CLIP VESOCCLUDE MED 24/CT (CLIP) ×3 IMPLANT
CLIP VESOCCLUDE SM WIDE 24/CT (CLIP) ×3 IMPLANT
CNTNR URN SCR LID CUP LEK RST (MISCELLANEOUS) ×3 IMPLANT
CONT SPEC 4OZ STRL OR WHT (MISCELLANEOUS) ×2
COUNTER NEEDLE 20 DBL MAG RED (NEEDLE) ×2 IMPLANT
COVER BACK TABLE 24X17X13 BIG (DRAPES) IMPLANT
COVER BACK TABLE 60X90IN (DRAPES) ×11 IMPLANT
COVER WAND RF STERILE (DRAPES) ×12 IMPLANT
DECANTER SPIKE VIAL GLASS SM (MISCELLANEOUS) ×11 IMPLANT
DERMABOND ADVANCED (GAUZE/BANDAGES/DRESSINGS) ×10
DERMABOND ADVANCED .7 DNX12 (GAUZE/BANDAGES/DRESSINGS) ×12 IMPLANT
DIFFUSER DRILL AIR PNEUMATIC (MISCELLANEOUS) ×6 IMPLANT
DRAPE C-ARM 42X72 X-RAY (DRAPES) ×15 IMPLANT
DRAPE C-ARMOR (DRAPES) ×15 IMPLANT
DRAPE INCISE IOBAN 66X45 STRL (DRAPES) ×3 IMPLANT
DRAPE LAPAROTOMY 100X72X124 (DRAPES) ×15 IMPLANT
DRAPE SURG 17X23 STRL (DRAPES) ×7 IMPLANT
DRSG OPSITE POSTOP 3X4 (GAUZE/BANDAGES/DRESSINGS) ×2 IMPLANT
DRSG OPSITE POSTOP 4X10 (GAUZE/BANDAGES/DRESSINGS) ×4 IMPLANT
DRSG OPSITE POSTOP 4X6 (GAUZE/BANDAGES/DRESSINGS) ×6 IMPLANT
DURAPREP 26ML APPLICATOR (WOUND CARE) ×15 IMPLANT
ELECT BLADE 4.0 EZ CLEAN MEGAD (MISCELLANEOUS) ×5
ELECT REM PT RETURN 9FT ADLT (ELECTROSURGICAL) ×10
ELECTRODE BLDE 4.0 EZ CLN MEGD (MISCELLANEOUS) IMPLANT
ELECTRODE REM PT RTRN 9FT ADLT (ELECTROSURGICAL) ×9 IMPLANT
GAUZE 4X4 16PLY RFD (DISPOSABLE) ×4 IMPLANT
GAUZE SPONGE 4X4 12PLY STRL (GAUZE/BANDAGES/DRESSINGS) ×3 IMPLANT
GLOVE BIO SURGEON STRL SZ 6.5 (GLOVE) ×2 IMPLANT
GLOVE BIO SURGEON STRL SZ7 (GLOVE) ×6 IMPLANT
GLOVE BIO SURGEON STRL SZ8 (GLOVE) ×18 IMPLANT
GLOVE BIO SURGEONS STRL SZ 6.5 (GLOVE) ×2
GLOVE BIOGEL PI IND STRL 6.5 (GLOVE) IMPLANT
GLOVE BIOGEL PI IND STRL 7.0 (GLOVE) IMPLANT
GLOVE BIOGEL PI IND STRL 7.5 (GLOVE) ×3 IMPLANT
GLOVE BIOGEL PI IND STRL 8 (GLOVE) ×12 IMPLANT
GLOVE BIOGEL PI IND STRL 8.5 (GLOVE) ×12 IMPLANT
GLOVE BIOGEL PI INDICATOR 6.5 (GLOVE) ×4
GLOVE BIOGEL PI INDICATOR 7.0 (GLOVE) ×2
GLOVE BIOGEL PI INDICATOR 7.5 (GLOVE) ×4
GLOVE BIOGEL PI INDICATOR 8 (GLOVE) ×24
GLOVE BIOGEL PI INDICATOR 8.5 (GLOVE) ×6
GLOVE ECLIPSE 7.0 STRL STRAW (GLOVE) ×2 IMPLANT
GLOVE ECLIPSE 8.0 STRL XLNG CF (GLOVE) ×20 IMPLANT
GLOVE EXAM NITRILE XL STR (GLOVE) IMPLANT
GLOVE SURG SS PI 7.5 STRL IVOR (GLOVE) ×5 IMPLANT
GOWN STRL REUS W/ TWL LRG LVL3 (GOWN DISPOSABLE) ×6 IMPLANT
GOWN STRL REUS W/ TWL XL LVL3 (GOWN DISPOSABLE) ×15 IMPLANT
GOWN STRL REUS W/TWL 2XL LVL3 (GOWN DISPOSABLE) ×22 IMPLANT
GOWN STRL REUS W/TWL LRG LVL3 (GOWN DISPOSABLE) ×6
GOWN STRL REUS W/TWL XL LVL3 (GOWN DISPOSABLE) ×8
GUIDEWIRE NITINOL BEVEL TIP (WIRE) ×2 IMPLANT
HEMOSTAT POWDER SURGIFOAM 1G (HEMOSTASIS) ×5 IMPLANT
INSERT FOGARTY 61MM (MISCELLANEOUS) IMPLANT
INSERT FOGARTY SM (MISCELLANEOUS) IMPLANT
KIT BASIN OR (CUSTOM PROCEDURE TRAY) ×13 IMPLANT
KIT DILATOR XLIF 5 (KITS) IMPLANT
KIT INFUSE SMALL (Orthopedic Implant) ×2 IMPLANT
KIT INFUSE XX SMALL 0.7CC (Orthopedic Implant) ×2 IMPLANT
KIT POSITION SURG JACKSON T1 (MISCELLANEOUS) ×5 IMPLANT
KIT SURGICAL ACCESS MAXCESS 4 (KITS) ×2 IMPLANT
KIT TURNOVER KIT B (KITS) ×14 IMPLANT
KIT XLIF (KITS) ×2
LOOP VESSEL MAXI BLUE (MISCELLANEOUS) ×3 IMPLANT
LOOP VESSEL MINI RED (MISCELLANEOUS) ×3 IMPLANT
MARKER SKIN DUAL TIP RULER LAB (MISCELLANEOUS) ×9 IMPLANT
MODULE NVM5 NEXT GEN EMG (NEEDLE) ×2 IMPLANT
MODULUS XLW 10X22X55MM 10 (Spine Construct) ×2 IMPLANT
MODULUS XLW 12X22X50MM 10DEG (Spine Construct) ×4 IMPLANT
NDL HYPO 21X1.5 SAFETY (NEEDLE) IMPLANT
NDL HYPO 25X1 1.5 SAFETY (NEEDLE) ×6 IMPLANT
NDL SPNL 18GX3.5 QUINCKE PK (NEEDLE) ×3 IMPLANT
NEEDLE HYPO 21X1.5 SAFETY (NEEDLE) ×10 IMPLANT
NEEDLE HYPO 25X1 1.5 SAFETY (NEEDLE) ×10 IMPLANT
NEEDLE SPNL 18GX3.5 QUINCKE PK (NEEDLE) ×5 IMPLANT
NS IRRIG 1000ML POUR BTL (IV SOLUTION) ×15 IMPLANT
OIL CARTRIDGE MAESTRO DRILL (MISCELLANEOUS)
PACK LAMINECTOMY NEURO (CUSTOM PROCEDURE TRAY) ×15 IMPLANT
PAD ARMBOARD 7.5X6 YLW CONV (MISCELLANEOUS) ×25 IMPLANT
PATTIES SURGICAL .5 X.5 (GAUZE/BANDAGES/DRESSINGS) IMPLANT
PATTIES SURGICAL .5 X1 (DISPOSABLE) IMPLANT
PATTIES SURGICAL 1X1 (DISPOSABLE) IMPLANT
PUTTY BONE ATTRAX 10CC STRIP (Putty) ×4 IMPLANT
PUTTY BONE ATTRAX 5CC STRIP (Putty) ×2 IMPLANT
ROD SPINE 170 (Rod) ×4 IMPLANT
SCREW LOCK RELINE 5.5 TULIP (Screw) ×24 IMPLANT
SCREW MAS RELINE 6.5X45 POLY (Screw) ×12 IMPLANT
SCREW RELINE MAS POLY 5.5X45MM (Screw) ×8 IMPLANT
SCREW RELINE MAS POLY 7.5X45MM (Screw) ×4 IMPLANT
SPONGE INTESTINAL PEANUT (DISPOSABLE) ×7 IMPLANT
SPONGE LAP 18X18 RF (DISPOSABLE) ×5 IMPLANT
SPONGE LAP 4X18 RFD (DISPOSABLE) IMPLANT
SPONGE SURGIFOAM ABS GEL 100 (HEMOSTASIS) IMPLANT
SPONGE SURGIFOAM ABS GEL SZ50 (HEMOSTASIS) ×3 IMPLANT
STAPLER SKIN PROX WIDE 3.9 (STAPLE) ×3 IMPLANT
STAPLER VISISTAT 35W (STAPLE) ×2 IMPLANT
SUT MNCRL AB 4-0 PS2 18 (SUTURE) ×5 IMPLANT
SUT PDS AB 1 CTX 36 (SUTURE) ×5 IMPLANT
SUT PROLENE 4 0 RB 1 (SUTURE)
SUT PROLENE 4-0 RB1 .5 CRCL 36 (SUTURE) ×12 IMPLANT
SUT PROLENE 5 0 CC1 (SUTURE) IMPLANT
SUT PROLENE 6 0 C 1 30 (SUTURE) ×3 IMPLANT
SUT PROLENE 6 0 CC (SUTURE) IMPLANT
SUT SILK 0 TIES 10X30 (SUTURE) ×3 IMPLANT
SUT SILK 2 0 TIES 10X30 (SUTURE) ×5 IMPLANT
SUT SILK 2 0 TIES 17X18 (SUTURE) ×2
SUT SILK 2 0SH CR/8 30 (SUTURE) IMPLANT
SUT SILK 2-0 18XBRD TIE BLK (SUTURE) ×3 IMPLANT
SUT SILK 3 0 TIES 10X30 (SUTURE) ×3 IMPLANT
SUT SILK 3 0SH CR/8 30 (SUTURE) IMPLANT
SUT VIC AB 0 CT1 27 (SUTURE)
SUT VIC AB 0 CT1 27XBRD ANBCTR (SUTURE) ×3 IMPLANT
SUT VIC AB 1 CT1 18XBRD ANBCTR (SUTURE) ×9 IMPLANT
SUT VIC AB 1 CT1 8-18 (SUTURE) ×8
SUT VIC AB 2-0 CT1 18 (SUTURE) ×20 IMPLANT
SUT VIC AB 2-0 CT1 27 (SUTURE)
SUT VIC AB 2-0 CT1 TAPERPNT 27 (SUTURE) ×3 IMPLANT
SUT VIC AB 3-0 SH 27 (SUTURE) ×12
SUT VIC AB 3-0 SH 27X BRD (SUTURE) ×3 IMPLANT
SUT VIC AB 3-0 SH 8-18 (SUTURE) ×16 IMPLANT
SUT VICRYL 4-0 PS2 18IN ABS (SUTURE) IMPLANT
SYR 20ML LL LF (SYRINGE) ×4 IMPLANT
SYR TB 1ML 25GX5/8 (SYRINGE) IMPLANT
TOWEL GREEN STERILE (TOWEL DISPOSABLE) ×15 IMPLANT
TOWEL GREEN STERILE FF (TOWEL DISPOSABLE) ×8 IMPLANT
TRAY FOLEY MTR SLVR 16FR STAT (SET/KITS/TRAYS/PACK) ×11 IMPLANT
WATER STERILE IRR 1000ML POUR (IV SOLUTION) ×11 IMPLANT

## 2019-10-27 NOTE — Transfer of Care (Signed)
Immediate Anesthesia Transfer of Care Note  Patient: Meagan Garrett  Procedure(s) Performed: Lumbar Five Sacral One Anterior lumbar interbody fusion (N/A Spine Lumbar) Right Lumbar One-Two Lumbar Two-Three Lumbar Three-Four Lumbar Four-Five Anterolateral lumbar fusion with percutaneous pedicle screws Lumbar 1 to Sacral 1 (Right Spine Lumbar) LUMBAR PERCUTANEOUS PEDICLE SCREW LUMBAR ONE-SACRAL ONE. (N/A Spine Lumbar) ABDOMINAL EXPOSURE (N/A Abdomen)  Patient Location: PACU  Anesthesia Type:General  Level of Consciousness: awake and alert   Airway & Oxygen Therapy: Patient Spontanous Breathing and Patient connected to face mask oxygen  Post-op Assessment: Report given to RN and Post -op Vital signs reviewed and stable  Post vital signs: Reviewed and stable  Last Vitals:  Vitals Value Taken Time  BP 113/63 10/27/19 1631  Temp    Pulse 125 10/27/19 1635  Resp 14 10/27/19 1635  SpO2 100 % 10/27/19 1635  Vitals shown include unvalidated device data.  Last Pain:  Vitals:   10/27/19 0605  PainSc: 10-Worst pain ever      Patients Stated Pain Goal: 3 (10/27/19 7182)  Complications: No apparent anesthesia complications

## 2019-10-27 NOTE — Anesthesia Postprocedure Evaluation (Signed)
Anesthesia Post Note  Patient: Meagan Garrett  Procedure(s) Performed: Lumbar Five Sacral One Anterior lumbar interbody fusion (N/A Spine Lumbar) Right Lumbar One-Two Lumbar Two-Three Lumbar Three-Four Lumbar Four-Five Anterolateral lumbar fusion with percutaneous pedicle screws Lumbar 1 to Sacral 1 (Right Spine Lumbar) LUMBAR PERCUTANEOUS PEDICLE SCREW LUMBAR ONE-SACRAL ONE. (N/A Spine Lumbar) ABDOMINAL EXPOSURE (N/A Abdomen)     Patient location during evaluation: PACU Anesthesia Type: General Level of consciousness: awake and alert Pain management: pain level controlled Vital Signs Assessment: post-procedure vital signs reviewed and stable Respiratory status: spontaneous breathing, nonlabored ventilation, respiratory function stable and patient connected to nasal cannula oxygen Cardiovascular status: blood pressure returned to baseline and stable Postop Assessment: no apparent nausea or vomiting Anesthetic complications: no    Last Vitals:  Vitals:   10/27/19 1748 10/27/19 1800  BP: 111/69 104/68  Pulse: 98 100  Resp: 20 15  Temp:  36.6 C  SpO2: 99% 100%    Last Pain:  Vitals:   10/27/19 1748  PainSc: 10-Worst pain ever                 Meagan Garrett

## 2019-10-27 NOTE — Interval H&P Note (Signed)
History and Physical Interval Note:  10/27/2019 7:25 AM  Meagan Garrett  has presented today for surgery, with the diagnosis of Degenerative Lumbar spinal stenosis.  The various methods of treatment have been discussed with the patient and family. After consideration of risks, benefits and other options for treatment, the patient has consented to  Procedure(s) with comments: Lumbar 5 Sacral 1 Anterior lumbar interbody fusion (N/A) - Lumbar 5 Sacral 1 Anterior lumbar interbody fusion Right Lumbar 1-2 Lumbar 2-3 Lumbar 3-4 Lumbar 4-5 Anterolateral lumbar fusion with percutaneous pedicle screws Lumbar 1 to Sacral 1 (Right) - Right Lumbar 1-2 Lumbar 2-3 Lumbar 3-4 Lumbar 4-5 Anterolateral lumbar fusion with percutaneous pedicle screws Lumbar 1 to Sacral 1 LUMBAR PERCUTANEOUS PEDICLE SCREW 4 LEVEL (N/A) ABDOMINAL EXPOSURE (N/A) as a surgical intervention.  The patient's history has been reviewed, patient examined, no change in status, stable for surgery.  I have reviewed the patient's chart and labs.  Questions were answered to the patient's satisfaction.     Dorian Heckle

## 2019-10-27 NOTE — Op Note (Signed)
    Patient name: Meagan Garrett MRN: [id number] DOB: 09-May-1951 Sex: female  10/27/2019 Pre-operative Diagnosis: Degenerative lower back disease Post-operative diagnosis:  Same Surgeon:  Durene Cal  Co-Surgeon:  Dr. Venetia Maxon Assistants:  Georgiann Cocker Procedure:   Anterior exposure L5-S1 Anesthesia: General Blood Loss: Minimal Specimens: None  Findings: Normal anatomy  Indications: Anterior approach for instrumentation at the L5-S1 level was recommended by Dr. Venetia Maxon.  I have been asked to provide anterior exposure.  The risks and benefits of the procedure were discussed preoperatively with the patient.  All questions were answered.  Procedure:  The patient was identified in the holding area and taken to [facility] OR ROOM 21  The patient was then placed supine on the table. general anesthesia was administered.  The patient was prepped and draped in the usual sterile fashion.  A time out was called and antibiotics were administered.  Fluoroscopy was used to identify the appropriate level of the skin incision.  A left lower quadrant transverse incision was made beginning midline and extending laterally.  Cautery was used divide subcutaneous tissue down to the abdominal wall fascia which was opened with cautery.  Subfascial flaps were then raised.  I then entered the retroperitoneal space lateral to the rectus muscle.  The peritoneum was swept medially.  Identified the left external iliac artery and proceeded with blunt dissection of the tissue up to the iliac bifurcation.  The ureter was identified and mobilized and protected laterally.  The iliac vein was mobilized and protected laterally.  I then proceeded with blunt dissection of the L5-S1 disc space.  The median sacral vessels were divided between silk ties.  Once the tissues were mobilized, I was able to place self-retaining retractors on either side of the spine.  Malleable retractors were then placed superiorly and inferiorly.  At this point, fluoroscopy  returned and confirmed that we were at the appropriate level.  At this portion of procedure, Dr. Venetia Maxon took over for his portion.  Please see his detailed operative note.  He performed closure of the wound.   Disposition: To PACU stable.   Juleen China, M.D., [facility] [place] Office: [phone] Pager:  [phone]

## 2019-10-27 NOTE — Anesthesia Procedure Notes (Signed)
Central Venous Catheter Insertion Performed by: Dorris Singh, MD, anesthesiologist Start/End3/06/2020 6:50 AM, 10/27/2019 7:10 AM Preanesthetic checklist: patient identified, IV checked, site marked, risks and benefits discussed, surgical consent, monitors and equipment checked, pre-op evaluation and timeout performed Position: reverse Trendelenburg Lidocaine 1% used for infiltration and patient sedated Hand hygiene performed , maximum sterile barriers used  and Seldinger technique used Central line was placed.Double lumen Procedure performed using ultrasound guided technique. Ultrasound Notes:anatomy identified and image(s) printed for medical record Attempts: 1 Following insertion, line sutured, dressing applied and Biopatch. Post procedure assessment: blood return through all ports  Patient tolerated the procedure well with no immediate complications.

## 2019-10-27 NOTE — Anesthesia Procedure Notes (Signed)
Arterial Line Insertion Start/End3/06/2020 7:10 AM, 10/27/2019 7:15 AM Performed by: Waynard Edwards, CRNA, CRNA  Patient location: Pre-op. Preanesthetic checklist: patient identified, IV checked, site marked, risks and benefits discussed, surgical consent, monitors and equipment checked, pre-op evaluation, timeout performed and anesthesia consent Lidocaine 1% used for infiltration and patient sedated Right, radial was placed Catheter size: 20 G Hand hygiene performed  and maximum sterile barriers used  Allen's test indicative of satisfactory collateral circulation Attempts: 1 Procedure performed without using ultrasound guided technique. Following insertion, dressing applied and Biopatch. Post procedure assessment: normal and unchanged  Patient tolerated the procedure well with no immediate complications.

## 2019-10-27 NOTE — Interval H&P Note (Signed)
History and Physical Interval Note:  10/27/2019 7:23 AM  Meagan Garrett  has presented today for surgery, with the diagnosis of Degenerative Lumbar spinal stenosis.  The various methods of treatment have been discussed with the patient and family. After consideration of risks, benefits and other options for treatment, the patient has consented to  Procedure(s) with comments: Lumbar 5 Sacral 1 Anterior lumbar interbody fusion (N/A) - Lumbar 5 Sacral 1 Anterior lumbar interbody fusion Right Lumbar 1-2 Lumbar 2-3 Lumbar 3-4 Lumbar 4-5 Anterolateral lumbar fusion with percutaneous pedicle screws Lumbar 1 to Sacral 1 (Right) - Right Lumbar 1-2 Lumbar 2-3 Lumbar 3-4 Lumbar 4-5 Anterolateral lumbar fusion with percutaneous pedicle screws Lumbar 1 to Sacral 1 LUMBAR PERCUTANEOUS PEDICLE SCREW 4 LEVEL (N/A) ABDOMINAL EXPOSURE (N/A) as a surgical intervention.  The patient's history has been reviewed, patient examined, no change in status, stable for surgery.  I have reviewed the patient's chart and labs.  Questions were answered to the patient's satisfaction.     Durene Cal

## 2019-10-27 NOTE — Brief Op Note (Signed)
10/27/2019  4:15 PM  PATIENT:  Meagan Garrett  69 y.o. female  PRE-OPERATIVE DIAGNOSIS:  Scoliosis, Degenerative Lumbar spinal stenosis, spondylolisthesis, lumbar radiculopathy, lumbago  POST-OPERATIVE DIAGNOSIS:  Scoliosis, Degenerative Lumbar spinal stenosis, spondylolisthesis, lumbar radiculopathy, lumbago  PROCEDURE:  Procedure(s) with comments: Lumbar Five Sacral One Anterior lumbar interbody fusion (N/A) - Anterior exposure, Part-1 Right Lumbar One-Two Lumbar Two-Three Lumbar Three-Four Lumbar Four-Five Anterolateral lumbar fusion with percutaneous pedicle screws Lumbar 1 to Sacral 1 (Right) - Anterolateral, Part-2 LUMBAR PERCUTANEOUS PEDICLE SCREW LUMBAR ONE-SACRAL ONE. (N/A) - posterior, Part-3 ABDOMINAL EXPOSURE (N/A) - anterior , Part-1  SURGEON:  Surgeon(s) and Role: Panel 1:    Erline Levine, MD - Primary    * Consuella Lose, MD Panel 2:    * Serafina Mitchell, MD - Primary  PHYSICIAN ASSISTANT:   ASSISTANTS: P[place], RN   ANESTHESIA:   general  EBL:  150 mL   BLOOD ADMINISTERED:none  DRAINS: none   LOCAL MEDICATIONS USED:  MARCAINE    and LIDOCAINE   SPECIMEN:  No Specimen  DISPOSITION OF SPECIMEN:  N/A  COUNTS:  YES  TOURNIQUET:  * No tourniquets in log *  DICTATION: DICTATION:   INDICATIONS:  Pateint is 69 year old female with chronic and intractable back and bilateral lower extremity pain, left greater than right,  who has previously undergone posterior decompression at L 45 level on the left.  She has a lateral disc herniation and left foraminal stenosis at L 45 with spondylolisthesis and scoliosis.   It was elected to take her to surgery for anterior lumbar decompression at L 5 S 1 and XLIF at the L 12, L 23, L 34, L45 levels with percutaneous pedicle screw fixation L 1 - S 1 levels.   PROCEDURE:  Doctor [name] performed exposure and his portion of the procedure will be dictated separately.  Upon exposing the L 5 S1 level, a localizing X ray was  obtained with the C arm.  I then incised the anterior annulus and performed a thorough discectomy with wide ligamentous releases.  The endplates were cleared of disc and cartilagenous material and a thorough discectomy was performed with decompression of the ventral annulus and disc material.  After trials, a 15 degree, 8 x 38 x 28 Base titanium lordotic ALIF cage was placed and lagged with 3, 5.0  x 17.5 mm screws, one in L 5 and two in S 1. This titanium spacer was packed with extra extra small BMP and Attrax.  The implant was tamped into position and positioning was confirmed with C arm.   Locking mechanisms were engaged, soft tissues were inspected and found to be in good repair.   Fascia was closed with 1 PDS running stitch, skin edges closed with 2-0 and 3-0 vicryl sutures.  Wound was dressed with a sterile occlusive dressing.    Patient was then  placed in a left lateral decubitus position on the operative table and using orthogonally projected C-arm fluoroscopy the patient was placed so that the L 45 levels were visualized in AP and lateral plane. The patient was then taped into position.  Skin was marked along with a posterior finger dissection incision. Her flank was then prepped and draped in usual sterile fashion and incisions were made sequentially at L 45 level along the iliac crest and then at L 12  working between the 11 th and 12 th ribs. Finger dissection was made to enter the retroperitoneal space and then subsequently the probe was  inserted into the psoas muscle from the right side initially at the L45 level. After mapping the neural elements were able to dock the probe per the midpoint of this vertebral level and without indications electrically of too close proximity to the neural tissues. Subsequently the self-retaining tractor was.after sequential dilators were utilized the shim was employed and the interspace was cleared of psoas muscle and then incised. A thorough discectomy was  performed. Instruments were used to clear the interspace of disc material. After thorough discectomy was performed and this was performed using AP and lateral fluoroscopy a 10 lordotic by 50 x 18 mm implant was packed with small BMP and Attrax. This was tamped into position and its position was confirmed on AP and lateral fluoroscopy.  Hemostasis was assured. Finger dissection was made to enter the retroperitoneal space and then subsequently the probe was inserted into the psoas muscle from the right side at the L34 level. After mapping the neural elements were able to dock the probe per the midpoint of this vertebral level and without indications electrically of too close proximity to the neural tissues. Subsequently the self-retaining tractor was.after sequential dilators were utilized the shim was employed and the interspace was cleared of psoas muscle and then incised. A thorough discectomy was performed. Instruments were used to clear the interspace of disc material. After thorough discectomy was performed and this was performed using AP and lateral fluoroscopy a 10 lordotic by 55 x 22 mm implant was packed with  BMP and Attrax. This was tamped into position using the slides and its position was confirmed on AP and lateral fluoroscopy.  Hemostasis was assured Finger dissection was made to enter the retroperitoneal space and then subsequently the probe was inserted into the psoas muscle from the right side initially at the L23 level. After mapping the neural elements were able to dock the probe per the midpoint of this vertebral level and without indications electrically of too close proximity to the neural tissues. Subsequently the self-retaining tractor was.after sequential dilators were utilized the shim was employed and the interspace was cleared of psoas muscle and then incised. A thorough discectomy was performed. Instruments were used to clear the interspace of disc material. After thorough discectomy was  performed and this was performed using AP and lateral fluoroscopy a 12 lordotic by 50 x 22 mm implant was packed with BMP and Attrax. This was tamped into position  and its position was confirmed on AP and lateral fluoroscopy.  Hemostasis was assured at each level. Finger dissection was made to enter the retroperitoneal space and then subsequently the probe was inserted into the psoas muscle from the right side initially at the L 12  Level through the same incision at L 23. After mapping the neural elements were able to dock the probe per the midpoint of this vertebral level and without indications electrically of too close proximity to the neural tissues. Subsequently the self-retaining tractor was.after sequential dilators were utilized the shim was employed and the interspace was cleared of psoas muscle and then incised. A thorough discectomy was performed. Instruments were used to clear the interspace of disc material. After thorough discectomy was performed and this was performed using AP and lateral fluoroscopy a 10 lordotic by 50 x 22 mm implant was packed with the remaining BMP and Attrax. This was tamped into position  and its position was confirmed on AP and lateral fluoroscopy.  Hemostasis was assured at each level.   Incisions were closed with  0, 2-0, 3-0 vicryl sutures and dressed with Dermabond and an occlusive dressing.  The patient was then turned into a prone position on the [place] table on chest rolls and using AP and lateral fluoroscopy throughout this portion of the procedure, pedicle screws were placed using Nuvasive cannulated percutaneous screws. After placing guide wires at each level with the use of nerve monitoring throughout, Nuvasive Reline towers were docked on the L 1, L 2, L 3, L 4, L 5 S 1 levels.  Pedicle screws were placed bilaterally at L 2, L 3, L 4, L 5  (6.5 x 45),  and S1 (7.5 x 45 at each level). 170 mm rods were then affixed to the screw heads through a separate stab  incision and locked down on the screws. All connections were then torqued and the Towers were disassembled. The wounds were irrigated and then closed with 1, 2-0 and 3-0 Vicryl stitches. Long-acting Marcaine was infiltrated in subcutaneous tissues.  Sterile occlusive dressing was placed with Dermabond. The patient was then extubated in the operating room and taken to recovery in stable and satisfactory condition having tolerated his operation well. Counts were correct at the end of the case. Sterile occlusive dressing was placed with Dermabond. The patient was then extubated in the operating room and taken to recovery in stable and satisfactory condition having tolerated his operation well. Counts were correct at the end of the case.  Patient was extubated in the OR and taken to recovery having tolerated her surgery well.  Counts were correct.    Retractor Times: L 12 (21 mins); L 23 ( ); L 34 (18 mins); L 45 (22 mins).   PLAN OF CARE: Admit to inpatient   PATIENT DISPOSITION:  PACU - hemodynamically stable.   Delay start of Pharmacological VTE agent (>24hrs) due to surgical blood loss or risk of bleeding: yes

## 2019-10-27 NOTE — Anesthesia Procedure Notes (Signed)
Procedure Name: Intubation Date/Time: 10/27/2019 7:51 AM Performed by: Waynard Edwards, CRNA Pre-anesthesia Checklist: Patient identified, Emergency Drugs available, Suction available and Patient being monitored Patient Re-evaluated:Patient Re-evaluated prior to induction Oxygen Delivery Method: Circle system utilized Preoxygenation: Pre-oxygenation with 100% oxygen Induction Type: IV induction Ventilation: Mask ventilation without difficulty Laryngoscope Size: Miller and 2 Grade View: Grade I Tube type: Oral Tube size: 7.0 mm Number of attempts: 1 Airway Equipment and Method: Stylet Placement Confirmation: ETT inserted through vocal cords under direct vision,  positive ETCO2 and breath sounds checked- equal and bilateral Secured at: 21 cm Tube secured with: Tape Dental Injury: Teeth and Oropharynx as per pre-operative assessment

## 2019-10-27 NOTE — Op Note (Signed)
10/27/2019  4:15 PM  PATIENT:  Meagan Garrett  69 y.o. female  PRE-OPERATIVE DIAGNOSIS:  Scoliosis, Degenerative Lumbar spinal stenosis, spondylolisthesis, lumbar radiculopathy, lumbago  POST-OPERATIVE DIAGNOSIS:  Scoliosis, Degenerative Lumbar spinal stenosis, spondylolisthesis, lumbar radiculopathy, lumbago  PROCEDURE:  Procedure(s) with comments: Lumbar Five Sacral One Anterior lumbar interbody fusion (N/A) - Anterior exposure, Part-1 Right Lumbar One-Two Lumbar Two-Three Lumbar Three-Four Lumbar Four-Five Anterolateral lumbar fusion with percutaneous pedicle screws Lumbar 1 to Sacral 1 (Right) - Anterolateral, Part-2 LUMBAR PERCUTANEOUS PEDICLE SCREW LUMBAR ONE-SACRAL ONE. (N/A) - posterior, Part-3 ABDOMINAL EXPOSURE (N/A) - anterior , Part-1  SURGEON:  Surgeon(s) and Role: Panel 1:    Erline Levine, MD - Primary    * Consuella Lose, MD Panel 2:    * Serafina Mitchell, MD - Primary  PHYSICIAN ASSISTANT:   ASSISTANTS: Poteat, RN   ANESTHESIA:   general  EBL:  150 mL   BLOOD ADMINISTERED:none  DRAINS: none   LOCAL MEDICATIONS USED:  MARCAINE    and LIDOCAINE   SPECIMEN:  No Specimen  DISPOSITION OF SPECIMEN:  N/A  COUNTS:  YES  TOURNIQUET:  * No tourniquets in log *  DICTATION: DICTATION:   INDICATIONS:  Pateint is 69 year old female with chronic and intractable back and bilateral lower extremity pain, left greater than right,  who has previously undergone posterior decompression at L 45 level on the left.  She has a lateral disc herniation and left foraminal stenosis at L 45 with spondylolisthesis and scoliosis.   It was elected to take her to surgery for anterior lumbar decompression at L 5 S 1 and XLIF at the L 12, L 23, L 34, L45 levels with percutaneous pedicle screw fixation L 1 - S 1 levels.   PROCEDURE:  Doctor [name] performed exposure and his portion of the procedure will be dictated separately.  Upon exposing the L 5 S1 level, a localizing X ray was  obtained with the C arm.  I then incised the anterior annulus and performed a thorough discectomy with wide ligamentous releases.  The endplates were cleared of disc and cartilagenous material and a thorough discectomy was performed with decompression of the ventral annulus and disc material.  After trials, a 15 degree, 8 x 38 x 28 Base titanium lordotic ALIF cage was placed and lagged with 3, 5.0  x 17.5 mm screws, one in L 5 and two in S 1. This titanium spacer was packed with extra extra small BMP and Attrax.  The implant was tamped into position and positioning was confirmed with C arm.   Locking mechanisms were engaged, soft tissues were inspected and found to be in good repair.   Fascia was closed with 1 PDS running stitch, skin edges closed with 2-0 and 3-0 vicryl sutures.  Wound was dressed with a sterile occlusive dressing.    Patient was then  placed in a left lateral decubitus position on the operative table and using orthogonally projected C-arm fluoroscopy the patient was placed so that the L 45 levels were visualized in AP and lateral plane. The patient was then taped into position.  Skin was marked along with a posterior finger dissection incision. Her flank was then prepped and draped in usual sterile fashion and incisions were made sequentially at L 45 level along the iliac crest and then at L 12  working between the 11 th and 12 th ribs. Finger dissection was made to enter the retroperitoneal space and then subsequently the probe was  inserted into the psoas muscle from the right side initially at the L45 level. After mapping the neural elements were able to dock the probe per the midpoint of this vertebral level and without indications electrically of too close proximity to the neural tissues. Subsequently the self-retaining tractor was.after sequential dilators were utilized the shim was employed and the interspace was cleared of psoas muscle and then incised. A thorough discectomy was  performed. Instruments were used to clear the interspace of disc material. After thorough discectomy was performed and this was performed using AP and lateral fluoroscopy a 10 lordotic by 50 x 18 mm implant was packed with small BMP and Attrax. This was tamped into position and its position was confirmed on AP and lateral fluoroscopy.  Hemostasis was assured. Finger dissection was made to enter the retroperitoneal space and then subsequently the probe was inserted into the psoas muscle from the right side at the L34 level. After mapping the neural elements were able to dock the probe per the midpoint of this vertebral level and without indications electrically of too close proximity to the neural tissues. Subsequently the self-retaining tractor was.after sequential dilators were utilized the shim was employed and the interspace was cleared of psoas muscle and then incised. A thorough discectomy was performed. Instruments were used to clear the interspace of disc material. After thorough discectomy was performed and this was performed using AP and lateral fluoroscopy a 10 lordotic by 55 x 22 mm implant was packed with  BMP and Attrax. This was tamped into position using the slides and its position was confirmed on AP and lateral fluoroscopy.  Hemostasis was assured Finger dissection was made to enter the retroperitoneal space and then subsequently the probe was inserted into the psoas muscle from the right side initially at the L23 level. After mapping the neural elements were able to dock the probe per the midpoint of this vertebral level and without indications electrically of too close proximity to the neural tissues. Subsequently the self-retaining tractor was.after sequential dilators were utilized the shim was employed and the interspace was cleared of psoas muscle and then incised. A thorough discectomy was performed. Instruments were used to clear the interspace of disc material. After thorough discectomy was  performed and this was performed using AP and lateral fluoroscopy a 12 lordotic by 50 x 22 mm implant was packed with BMP and Attrax. This was tamped into position  and its position was confirmed on AP and lateral fluoroscopy.  Hemostasis was assured at each level. Finger dissection was made to enter the retroperitoneal space and then subsequently the probe was inserted into the psoas muscle from the right side initially at the L 12  Level through the same incision at L 23. After mapping the neural elements were able to dock the probe per the midpoint of this vertebral level and without indications electrically of too close proximity to the neural tissues. Subsequently the self-retaining tractor was.after sequential dilators were utilized the shim was employed and the interspace was cleared of psoas muscle and then incised. A thorough discectomy was performed. Instruments were used to clear the interspace of disc material. After thorough discectomy was performed and this was performed using AP and lateral fluoroscopy a 10 lordotic by 50 x 22 mm implant was packed with the remaining BMP and Attrax. This was tamped into position  and its position was confirmed on AP and lateral fluoroscopy.  Hemostasis was assured at each level.   Incisions were closed with  0, 2-0, 3-0 vicryl sutures and dressed with Dermabond and an occlusive dressing.  The patient was then turned into a prone position on the Jackson table on chest rolls and using AP and lateral fluoroscopy throughout this portion of the procedure, pedicle screws were placed using Nuvasive cannulated percutaneous screws. After placing guide wires at each level with the use of nerve monitoring throughout, Nuvasive Reline towers were docked on the L 1, L 2, L 3, L 4, L 5 S 1 levels.  Pedicle screws were placed bilaterally at L 2, L 3, L 4, L 5  (6.5 x 45),  and S1 (7.5 x 45 at each level). 170 mm rods were then affixed to the screw heads through a separate stab  incision and locked down on the screws. All connections were then torqued and the Towers were disassembled. The wounds were irrigated and then closed with 1, 2-0 and 3-0 Vicryl stitches. Long-acting Marcaine was infiltrated in subcutaneous tissues.  Sterile occlusive dressing was placed with Dermabond. The patient was then extubated in the operating room and taken to recovery in stable and satisfactory condition having tolerated his operation well. Counts were correct at the end of the case. Sterile occlusive dressing was placed with Dermabond. The patient was then extubated in the operating room and taken to recovery in stable and satisfactory condition having tolerated his operation well. Counts were correct at the end of the case.  Patient was extubated in the OR and taken to recovery having tolerated her surgery well.  Counts were correct.    Retractor Times: L 12 (21 mins); L 23 (33mins); L 34 (18 mins); L 45 (22 mins).   PLAN OF CARE: Admit to inpatient   PATIENT DISPOSITION:  PACU - hemodynamically stable.   Delay start of Pharmacological VTE agent (>24hrs) due to surgical blood loss or risk of bleeding: yes  

## 2019-10-28 MED ORDER — SODIUM CHLORIDE 0.9% FLUSH: 10.0000 mL | Freq: Two times a day (BID) | INTRAVENOUS | Status: DC

## 2019-10-28 MED ORDER — PANTOPRAZOLE SODIUM 40 MG PO TBEC
40.0000 mg | DELAYED_RELEASE_TABLET | Freq: Every day | ORAL | Status: DC
  Administered 2019-10-28 – 2019-11-01 (×5): 40 mg via ORAL
  Filled 2019-10-28 (×5): qty 1

## 2019-10-28 MED ORDER — CHLORHEXIDINE GLUCONATE CLOTH 2 % EX PADS: 6.0000 | MEDICATED_PAD | Freq: Every day | CUTANEOUS | Status: DC

## 2019-10-28 MED ORDER — OXYCODONE HCL 5 MG PO TABS
5.0000 mg | ORAL_TABLET | ORAL | Status: DC | PRN
  Administered 2019-10-28 – 2019-11-02 (×20): 10 mg via ORAL
  Filled 2019-10-28 (×22): qty 2

## 2019-10-28 MED ORDER — SENNOSIDES-DOCUSATE SODIUM 8.6-50 MG PO TABS
1.0000 | ORAL_TABLET | Freq: Every day | ORAL | Status: DC
  Administered 2019-10-28 – 2019-11-01 (×6): 1 via ORAL
  Filled 2019-10-28 (×6): qty 1

## 2019-10-28 MED ORDER — SODIUM CHLORIDE 0.9% FLUSH: 10.0000 mL | INTRAVENOUS | Status: DC | PRN

## 2019-10-28 NOTE — Evaluation (Signed)
Occupational Therapy Evaluation Patient Details Name: Meagan Garrett MRN: [id number] DOB: [birth year] Today's Date: 10/28/2019    History of Present Illness 69 yo female s/p L5-S1 ALIF, R L1-L5 anterolateral lumbar fusion with pedicle screws L1-S1 on 10/28/19. PMH includes ACDF C5-6 2004, rapid HR, osteopenia, scoliosis, HTN, disc herniation.   Clinical Impression   PTA, pt was living with her husband and was independent. Currently, pt requires Mod-Max A for LB ADLs and Min Guard-Min A for functional mobility using RW. Provided education and handout on bed mobility, back precautions, brace management, LB ADLs with AE, and functional transfer. Pt with decreased activity tolerance due to pain and husband confirming he will assist with ADLs at home. Will follow acutely to optimize safe dc. Recommend dc home once medically stable per physician.      Follow Up Recommendations  No OT follow up    Equipment Recommendations  3 in 1 bedside commode    Recommendations for Other Services PT consult     Precautions / Restrictions Precautions Precautions: Fall;Back Precaution Booklet Issued: Yes (comment) Precaution Comments: Reviewed back precautions Required Braces or Orthoses: Spinal Brace Spinal Brace: Lumbar corset;Applied in sitting position Restrictions Weight Bearing Restrictions: No      Mobility Bed Mobility Overal bed mobility: Needs Assistance Bed Mobility: Rolling;Sidelying to Sit;Sit to Sidelying Rolling: Min guard Sidelying to sit: HOB elevated;Min guard     Sit to sidelying: HOB elevated;Min guard General bed mobility comments: Min guard A for safety using log roll technique  Transfers Overall transfer level: Needs assistance Equipment used: Rolling walker (2 wheeled) Transfers: Sit to/from Stand Sit to Stand: Min assist;From elevated surface         General transfer comment: Min A to steady once in standing    Balance Overall balance assessment: Needs  assistance Sitting-balance support: No upper extremity supported;Feet supported Sitting balance-Leahy Scale: Fair     Standing balance support: No upper extremity supported;During functional activity Standing balance-Leahy Scale: Fair Standing balance comment: able to stand without UE support                           ADL either performed or assessed with clinical judgement   ADL Overall ADL's : Needs assistance/impaired Eating/Feeding: Independent;Sitting   Grooming: Supervision/safety;Set up;Bed level;Sitting   Upper Body Bathing: Minimal assistance;Sitting   Lower Body Bathing: With caregiver independent assisting;Sit to/from stand;Moderate assistance   Upper Body Dressing : Maximal assistance;Sitting Upper Body Dressing Details (indicate cue type and reason): Max A to don brace. Pt donning night gown without assistance Lower Body Dressing: Maximal assistance;Sit to/from stand;With caregiver independent assisting;With adaptive equipment;Cueing for compensatory techniques Lower Body Dressing Details (indicate cue type and reason): Educating pt on use of AE for donning underwear. Pt becoming quickly frustrating andpresenting with difficulty following simple commands. Pt's husband confirming that he will assist with LB ADLs Toilet Transfer: Minimal assistance;Ambulation;RW(simulated in room)           Functional mobility during ADLs: Rolling walker;Min guard General ADL Comments: Pt presenting with limiting activity tolerance due to pain     Vision         Perception     Praxis      Pertinent Vitals/Pain Pain Assessment: Faces Faces Pain Scale: Hurts even more Pain Location: back, abdomen Pain Descriptors / Indicators: Sore;Discomfort;Constant Pain Intervention(s): Monitored during session;Limited activity within patient's tolerance;Repositioned     Hand Dominance Right   Extremity/Trunk Assessment Upper Extremity Assessment Upper  Extremity  Assessment: Generalized weakness   Lower Extremity Assessment Lower Extremity Assessment: Defer to PT evaluation   Cervical / Trunk Assessment Cervical / Trunk Assessment: Other exceptions Cervical / Trunk Exceptions: s/p lumbar fusion   Communication Communication Communication: No difficulties   Cognition Arousal/Alertness: Awake/alert Behavior During Therapy: WFL for tasks assessed/performed;Anxious Overall Cognitive Status: Impaired/Different from baseline Area of Impairment: Problem solving                             Problem Solving: Requires verbal cues General Comments: Pt becoming frusted very quickly and with poor problem solving   General Comments  husband present throughotu and very supportive    Exercises     Shoulder Instructions      Home Living Family/patient expects to be discharged to:: Private residence Living Arrangements: Spouse/significant other Available Help at Discharge: Family Type of Home: House Home Access: Stairs to enter [employer] of Steps: a couple   Home Layout: One level     Bathroom Shower/Tub: Psychologist, counselling;Tub/shower unit   Bathroom Toilet: Standard     Home Equipment: Environmental consultant - 2 wheels   Additional Comments: RW belongs to mother in law      Prior Functioning/Environment Level of Independence: Independent        Comments: pt reports independence with mobility and ADLs PTA        OT Problem List: Decreased strength;Decreased range of motion;Decreased activity tolerance;Impaired balance (sitting and/or standing);Decreased knowledge of use of DME or AE;Decreased knowledge of precautions;Pain      OT Treatment/Interventions: Self-care/ADL training;Therapeutic exercise;Energy conservation;DME and/or AE instruction;Therapeutic activities;Patient/family education    OT Goals(Current goals can be found in the care plan section) Acute Rehab OT Goals Patient Stated Goal: decrease my pain OT Goal  Formulation: With patient/family Time For Goal Achievement: 11/11/19 Potential to Achieve Goals: Good  OT Frequency: Min 2X/week   Barriers to D/C:            Co-evaluation              AM-PAC OT "6 Clicks" Daily Activity     Outcome Measure Help from another person eating meals?: None Help from another person taking care of personal grooming?: A Little Help from another person toileting, which includes using toliet, bedpan, or urinal?: A Little Help from another person bathing (including washing, rinsing, drying)?: A Lot Help from another person to put on and taking off regular upper body clothing?: A Lot Help from another person to put on and taking off regular lower body clothing?: A Lot 6 Click Score: 16   End of Session Equipment Utilized During Treatment: Back brace;Rolling walker Nurse Communication: Mobility status  Activity Tolerance: Patient tolerated treatment well Patient left: in bed;with call bell/phone within reach;with family/visitor present  OT Visit Diagnosis: Unsteadiness on feet (R26.81);Other abnormalities of gait and mobility (R26.89);Muscle weakness (generalized) (M62.81);Pain Pain - part of body: (Back)                Time: 3016-0109 OT Time Calculation (min): 24 min Charges:  OT General Charges $OT Visit: 1 Visit OT Evaluation $OT Eval Low Complexity: 1 Low OT Treatments $Self Care/Home Management : 8-22 mins  Tarrah Furuta MSOT, OTR/L Acute Rehab Pager: [phone] Office: [phone]  Theodoro Grist Quentyn Kolbeck 10/28/2019, 5:31 PM

## 2019-10-28 NOTE — Progress Notes (Signed)
  Progress Note    10/28/2019 8:12 AM 1 Day Post-Op  Subjective:  Nauseous and dizzy when standing. Abd feels "tight". No abd pain, vomiting. + flatus   Vitals:   10/28/19 0356 10/28/19 0733  BP: (!) 160/83 139/90  Pulse: 85 94  Resp: 18 16  Temp: 97.7 F (36.5 C) 98 F (36.7 C)  SpO2: 100% 94%    Physical Exam: Cardiac:  RRR Lungs:  nonlabored Incisions:  Dressing dry, intact. No bleeding or erythema Abdomen:  Non-distended, hypoactive BS, slightly tympanitic  CBC    Component Value Date/Time   WBC 8.4 10/24/2019 1359   RBC 3.94 10/24/2019 1359   HGB 12.6 10/27/2019 1503   HCT 37.0 10/27/2019 1503   PLT 414 (H) 10/24/2019 1359   MCV 103.0 (H) 10/24/2019 1359   MCH 34.8 (H) 10/24/2019 1359   MCHC 33.7 10/24/2019 1359   RDW 14.8 10/24/2019 1359    BMET    Component Value Date/Time   NA 139 10/27/2019 1503   K 3.4 (L) 10/27/2019 1503   CL 98 10/24/2019 1359   CO2 31 10/24/2019 1359   GLUCOSE 80 10/24/2019 1359   BUN 19 10/24/2019 1359   CREATININE 0.82 10/24/2019 1359   CALCIUM 10.4 (H) 10/24/2019 1359   GFRNONAA >60 10/24/2019 1359   GFRAA >60 10/24/2019 1359     Intake/Output Summary (Last 24 hours) at 10/28/2019 0812 Last data filed at 10/28/2019 0643 Gross per 24 hour  Intake 3250 ml  Output 1205 ml  Net 2045 ml    HOSPITAL MEDICATIONS Scheduled Meds: . calcium carbonate  1 tablet Oral BID WC  . Chlorhexidine Gluconate Cloth  6 each Topical Daily  . docusate sodium  100 mg Oral BID  . gabapentin  300 mg Oral TID  . metoprolol tartrate  12.5 mg Oral Q breakfast  . metoprolol tartrate  25 mg Oral QHS  . multivitamin with minerals  1 tablet Oral Daily  . pantoprazole (PROTONIX) IV  40 mg Intravenous QHS  . potassium chloride  10 mEq Oral Daily  . sodium chloride flush  10-40 mL Intracatheter Q12H  . sodium chloride flush  3 mL Intravenous Q12H  . vitamin B-12  2,500 mcg Oral Daily   Continuous Infusions: . sodium chloride    . dextrose 5  % and 0.45 % NaCl with KCl 20 mEq/L 75 mL/hr at 10/27/19 1846  . methocarbamol (ROBAXIN) IV     PRN Meds:.acetaminophen **OR** acetaminophen, alum & mag hydroxide-simeth, bisacodyl, hydrochlorothiazide, HYDROcodone-acetaminophen, HYDROcodone-acetaminophen, HYDROmorphone (DILAUDID) injection, menthol-cetylpyridinium **OR** phenol, methocarbamol **OR** methocarbamol (ROBAXIN) IV, ondansetron **OR** ondansetron (ZOFRAN) IV, oxyCODONE, polyethylene glycol, sodium chloride flush, sodium chloride flush, sodium phosphate, zolpidem  Assessment:  69 y.o. female is s/p:  Lumbar spine fusion anterior abd exposure    1 Day Post-Op  Plan: -Slow with po intake    Wendi Maya, PA-C [facility] [phone] 10/28/2019  8:12 AM

## 2019-10-28 NOTE — Progress Notes (Addendum)
Subjective: Patient reports "I just took a couple steps before I got dizzy"  Objective: Vital signs in last 24 hours: Temp:  [97.5 F (36.4 C)-97.8 F (36.6 C)] 97.7 F (36.5 C) (03/12 0356) Pulse Rate:  [80-126] 85 (03/12 0356) Resp:  [14-26] 18 (03/12 0356) BP: (95-160)/(60-85) 160/83 (03/12 0356) SpO2:  [94 %-100 %] 100 % (03/12 0356) Arterial Line BP: (100-126)/(50-65) 126/65 (03/11 1730)  Intake/Output from previous day: 03/11 0701 - 03/12 0700 In: 3250 [I.V.:2500; IV Piggyback:750] Out: 1225 [Urine:1075; Blood:150] Intake/Output this shift: No intake/output data recorded.  Alert, conversant. Reports significant back pain with position changes as expected. Good strength BLE. Incisions left abdomen, right side and lumbar without erythema, swelling or drainage. Belly soft, nontender. Passing gas.   Lab Results: Recent Labs    10/27/19 1239 10/27/19 1503  HGB 9.9* 12.6  HCT 29.0* 37.0   BMET Recent Labs    10/27/19 1239 10/27/19 1503  NA 143 139  K 2.7* 3.4*    Studies/Results: DG Lumbar Spine 2-3 Views  Result Date: 10/27/2019 CLINICAL DATA:  Lumbar fusion EXAM: LUMBAR SPINE - 2-3 VIEW; DG C-ARM 1-60 MIN COMPARISON:  08/31/2019 FLUOROSCOPY TIME:  Fluoroscopy Time:  6 minutes 16 seconds Radiation Exposure Index (if provided by the fluoroscopic device): Not available Number of Acquired Spot Images: 19 FINDINGS: Initial images demonstrate placement of a an anterior fusion device at L5-S1. Subsequent interbody fusion is performed at L4-5, L3-4, L2-3 and L1-L2. Pedicle screws are noted from L1-S1 with posterior fusion. IMPRESSION: Multilevel lumbar fusion from L1-S1. Electronically Signed   By: Alcide Clever M.D.   On: 10/27/2019 18:19   DG C-Arm 1-60 Min  Result Date: 10/27/2019 CLINICAL DATA:  Lumbar fusion EXAM: LUMBAR SPINE - 2-3 VIEW; DG C-ARM 1-60 MIN COMPARISON:  08/31/2019 FLUOROSCOPY TIME:  Fluoroscopy Time:  6 minutes 16 seconds Radiation Exposure Index (if  provided by the fluoroscopic device): Not available Number of Acquired Spot Images: 19 FINDINGS: Initial images demonstrate placement of a an anterior fusion device at L5-S1. Subsequent interbody fusion is performed at L4-5, L3-4, L2-3 and L1-L2. Pedicle screws are noted from L1-S1 with posterior fusion. IMPRESSION: Multilevel lumbar fusion from L1-S1. Electronically Signed   By: Alcide Clever M.D.   On: 10/27/2019 18:19   DG OR LOCAL ABDOMEN  Result Date: 10/27/2019 CLINICAL DATA:  Evaluate for retained instruments. Status post anterior and interbody fusion at L5-S1. EXAM: OR LOCAL ABDOMEN COMPARISON:  None. FINDINGS: No unexpected radiopaque foreign bodies are identified. And interbody fusion devices in place at L5-S1. IMPRESSION: No unexpected radiopaque foreign bodies. Electronically Signed   By: Rudie Meyer M.D.   On: 10/27/2019 10:02    Assessment/Plan: Stable post-op day 1  LOS: 1 day  Mobilize in brace with therapies   Georgiann Cocker 10/28/2019, 7:28 AM   Patient is doing well after surgery.  Mobilize in brace with PT as tolerated.

## 2019-10-28 NOTE — Progress Notes (Signed)
OT Cancellation Note  Patient Details Name: Meagan Garrett MRN: [id number] DOB: [birth year]   Cancelled Treatment:    Reason Eval/Treat Not Completed: Patient at procedure or test/ unavailable(Drain pulled. Per RN must lay flat for 30 minutes.) Will return as schedule allows.  Cresta Riden M Eliazar Olivar Shifra Swartzentruber MSOT, OTR/L Acute Rehab Pager: [phone] Office: [phone] 10/28/2019, 8:05 AM

## 2019-10-28 NOTE — Discharge Instructions (Signed)
Wound Care Leave incision open to air., Remove Glue in 2 weeks You may shower. Do not scrub directly on incision.  Do not put any creams, lotions, or ointments on incision. Activity Walk each and every day, increasing distance each day. No lifting greater than 5 lbs.  Avoid bending, arching, and twisting. No driving for 2 weeks; may ride as a passenger locally. If provided with back brace, wear when out of bed.  It is not necessary to wear in bed. Diet Resume your normal diet.  Return to Work Will be discussed at you follow up appointment. Call Your Doctor If Any of These Occur Redness, drainage, or swelling at the wound.  Temperature greater than 101 degrees. Severe pain not relieved by pain medication. Incision starts to come apart. Follow Up Appt Call today for appointment in 3-4 weeks ([phone]) or for problems.  If you have any hardware placed in your spine, you will need an x-ray before your appointment.

## 2019-10-28 NOTE — Plan of Care (Signed)
  Problem: Activity: Goal: Ability to avoid complications of mobility impairment will improve Outcome: Progressing Goal: Ability to tolerate increased activity will improve Outcome: Progressing Goal: Will remain free from falls Outcome: Progressing   Problem: Bowel/Gastric: Goal: Gastrointestinal status for postoperative course will improve Outcome: Progressing   Problem: Clinical Measurements: Goal: Ability to maintain clinical measurements within normal limits will improve Outcome: Progressing Goal: Postoperative complications will be avoided or minimized Outcome: Progressing Goal: Diagnostic test results will improve Outcome: Progressing   Problem: Pain Management: Goal: Pain level will decrease Outcome: Progressing   Problem: Health Behavior/Discharge Planning: Goal: Identification of resources available to assist in meeting health care needs will improve Outcome: Progressing   Problem: Bladder/Genitourinary: Goal: Urinary functional status for postoperative course will improve Outcome: Progressing   Problem: Safety: Goal: Ability to remain free from injury will improve Outcome: Progressing

## 2019-10-28 NOTE — Evaluation (Signed)
Physical Therapy Evaluation Patient Details Name: Meagan Garrett MRN: [id number] DOB: 1950/10/08 Today's Date: 10/28/2019   History of Present Illness  69 yo female s/p L5-S1 ALIF, R L1-L5 anterolateral lumbar fusion with pedicle screws L1-S1 on 10/28/19. PMH includes ACDF C5-6 2004, rapid HR, osteopenia, scoliosis, HTN, disc herniation.  Clinical Impression   Pt presents with severe back pain post-operatively, decreased knowledge and application of back precautions, difficulty performing mobility tasks, impaired gait, and decreased activity tolerance. Pt to benefit from acute PT to address deficits. Pt ambulated very short room distance, limited by severe pain resulting in pt nausea and dizziness (subsided with rest). Pt educated on ankle pumps (20/hour) to perform this afternoon/evening to increase circulation, to pt's tolerance and limited by pain. At baseline, pt is independent with mobility and PT anticipates improved function with controlled pain. PT recommending HHPT with assist from husband during mobility. PT to progress mobility as tolerated, and will continue to follow acutely.        Follow Up Recommendations Home health PT;Supervision for mobility/OOB    Equipment Recommendations  3in1 (PT)    Recommendations for Other Services       Precautions / Restrictions Precautions Precautions: Fall;Back Precaution Booklet Issued: Yes (comment) Precaution Comments: Pt verbalizes 2/3 precautions, verbal cuing for 3rd. Instructed in log roll technique in and out of bed Required Braces or Orthoses: Spinal Brace Spinal Brace: Lumbar corset;Applied in sitting position Restrictions Weight Bearing Restrictions: No      Mobility  Bed Mobility Overal bed mobility: Needs Assistance Bed Mobility: Rolling;Sidelying to Sit;Sit to Sidelying Rolling: Min assist Sidelying to sit: Mod assist;HOB elevated     Sit to sidelying: Mod assist;HOB elevated General bed mobility comments: Min assist  for rolling on side and maintaining back precautions bilaterally, mod assist for sidelying<>sit for trunk and LE management, scooting to EOB with use of bed pad.  Transfers Overall transfer level: Needs assistance Equipment used: Rolling walker (2 wheeled) Transfers: Sit to/from Stand Sit to Stand: Min assist;From elevated surface         General transfer comment: Min assist for power up, steadying upon standing, very increased time to rise and steady. Verbal cuing for hand placement when rising.  Ambulation/Gait Ambulation/Gait assistance: Min assist Gait Distance (Feet): 5 Feet(5+5) Assistive device: Rolling walker (2 wheeled) Gait Pattern/deviations: Step-through pattern;Decreased stride length;Trunk flexed Gait velocity: decr   General Gait Details: min assist to steady, very increased time. Verbal cuing for placement in RW, could not tolerate further ambulation due to pain, nausea, and dizziness (subsided in sitting).  Stairs            Wheelchair Mobility    Modified Rankin (Stroke Patients Only)       Balance Overall balance assessment: Needs assistance Sitting-balance support: No upper extremity supported;Feet supported Sitting balance-Leahy Scale: Fair     Standing balance support: Bilateral upper extremity supported Standing balance-Leahy Scale: Poor Standing balance comment: reliant on external support                             Pertinent Vitals/Pain Pain Assessment: 0-10 Pain Score: 10-Worst pain ever Pain Location: back, abdomen Pain Descriptors / Indicators: Sore;Crying;Discomfort Pain Intervention(s): Limited activity within patient's tolerance;Monitored during session;Premedicated before session;Repositioned    Home Living Family/patient expects to be discharged to:: Private residence Living Arrangements: Spouse/significant other Available Help at Discharge: Family Type of Home: House Home Access: Stairs to enter   [employer]  of Steps: a couple Home Layout: One level Home Equipment: Environmental consultant - 2 wheels Additional Comments: RW belongs to mother in law    Prior Function Level of Independence: Independent         Comments: pt reports independence with mobility and ADLs PTA     Hand Dominance   Dominant Hand: Right    Extremity/Trunk Assessment   Upper Extremity Assessment Upper Extremity Assessment: Defer to OT evaluation    Lower Extremity Assessment Lower Extremity Assessment: Generalized weakness    Cervical / Trunk Assessment Cervical / Trunk Assessment: Other exceptions Cervical / Trunk Exceptions: s/p lumbar fusion  Communication   Communication: No difficulties  Cognition Arousal/Alertness: Awake/alert Behavior During Therapy: WFL for tasks assessed/performed Overall Cognitive Status: Within Functional Limits for tasks assessed                                 General Comments: very increased pain, but follows commands well      General Comments      Exercises     Assessment/Plan    PT Assessment Patient needs continued PT services  PT Problem List Decreased strength;Decreased mobility;Decreased safety awareness;Decreased knowledge of precautions;Decreased activity tolerance;Decreased balance;Pain;Decreased knowledge of use of DME       PT Treatment Interventions DME instruction;Therapeutic activities;Gait training;Therapeutic exercise;Patient/family education;Balance training;Stair training;Neuromuscular re-education;Functional mobility training    PT Goals (Current goals can be found in the Care Plan section)  Acute Rehab PT Goals Patient Stated Goal: decrease my pain PT Goal Formulation: With patient Time For Goal Achievement: 11/04/19 Potential to Achieve Goals: Good    Frequency Min 5X/week   Barriers to discharge        Co-evaluation               AM-PAC PT "6 Clicks" Mobility  Outcome Measure Help needed turning from your  back to your side while in a flat bed without using bedrails?: A Little Help needed moving from lying on your back to sitting on the side of a flat bed without using bedrails?: A Lot Help needed moving to and from a bed to a chair (including a wheelchair)?: A Lot Help needed standing up from a chair using your arms (e.g., wheelchair or bedside chair)?: A Little Help needed to walk in hospital room?: A Little Help needed climbing 3-5 steps with a railing? : A Lot 6 Click Score: 15    End of Session Equipment Utilized During Treatment: Gait belt;Back brace Activity Tolerance: Patient limited by pain;Patient limited by fatigue Patient left: in bed;with call bell/phone within reach Nurse Communication: Mobility status PT Visit Diagnosis: Other abnormalities of gait and mobility (R26.89);Muscle weakness (generalized) (M62.81);Pain Pain - Right/Left: (mid) Pain - part of body: (back)    Time: 8250-0370 PT Time Calculation (min) (ACUTE ONLY): 19 min   Charges:   PT Evaluation $PT Eval Low Complexity: 1 Low         Sean Malinowski E, PT Acute Rehabilitation Services Pager [phone]  Office [phone]  Sai Moura D Elonda Husky 10/28/2019, 2:06 PM

## 2019-10-29 LAB — CBC
HCT: 33.2 % — ABNORMAL LOW (ref 36.0–46.0)
Hemoglobin: 11 g/dL — ABNORMAL LOW (ref 12.0–15.0)
MCH: 35 pg — ABNORMAL HIGH (ref 26.0–34.0)
MCHC: 33.1 g/dL (ref 30.0–36.0)
MCV: 105.7 fL — ABNORMAL HIGH (ref 80.0–100.0)
Platelets: 265 10*3/uL (ref 150–400)
RBC: 3.14 MIL/uL — ABNORMAL LOW (ref 3.87–5.11)
RDW: 14.7 % (ref 11.5–15.5)
WBC: 12 10*3/uL — ABNORMAL HIGH (ref 4.0–10.5)
nRBC: 0 % (ref 0.0–0.2)

## 2019-10-29 LAB — BASIC METABOLIC PANEL
Anion gap: 11 (ref 5–15)
BUN: 9 mg/dL (ref 8–23)
CO2: 25 mmol/L (ref 22–32)
Calcium: 9.4 mg/dL (ref 8.9–10.3)
Chloride: 102 mmol/L (ref 98–111)
Creatinine, Ser: 0.75 mg/dL (ref 0.44–1.00)
GFR calc Af Amer: >60 mL/min (ref >60)
GFR calc non Af Amer: >60 mL/min (ref >60)
Glucose, Bld: 135 mg/dL — ABNORMAL HIGH (ref 70–99)
Potassium: 4.3 mmol/L (ref 3.5–5.1)
Sodium: 138 mmol/L (ref 135–145)

## 2019-10-29 MED ORDER — SODIUM CHLORIDE 0.9 % IV SOLN: INTRAVENOUS | Status: DC

## 2019-10-29 NOTE — Progress Notes (Signed)
Occupational Therapy Treatment Patient Details Name: Meagan Garrett MRN: [id number] DOB: 08-01-1951 Today's Date: 10/29/2019    History of present illness 69 yo female s/p L5-S1 ALIF, R L1-L5 anterolateral lumbar fusion with pedicle screws L1-S1 on 10/28/19. PMH includes ACDF C5-6 2004, rapid HR, osteopenia, scoliosis, HTN, disc herniation.   OT comments  Pt's focus of session on BLEs dressing. Pt able to cross both BLEs, but pt unable to perform figure four technique without severe pain in low back when sitting upright. Pt reports that she denies need to see AE as pt's spouse will perform. Pt's spouse in room and verbal discussion of donning sore LE first. Pt denying need for OOB ADL as pt was just recovering from session with PT. Pt would benefit from continued OT skilled services for ADL. OT following.   Follow Up Recommendations  No OT follow up    Equipment Recommendations  3 in 1 bedside commode    Recommendations for Other Services      Precautions / Restrictions Precautions Precautions: Fall;Back Precaution Booklet Issued: Yes (comment) Precaution Comments: Reviewed back precautions Required Braces or Orthoses: Spinal Brace Spinal Brace: Lumbar corset;Applied in sitting position Restrictions Weight Bearing Restrictions: No       Mobility Bed Mobility               General bed mobility comments: in recliner  Transfers                      Balance Overall balance assessment: Needs assistance   Sitting balance-Leahy Scale: Fair         Standing balance comment: pt finally comfortable so pt wanting to defer standing today as she had just worked with PT.                           ADL either performed or assessed with clinical judgement   ADL Overall ADL's : Needs assistance/impaired                     Lower Body Dressing: Moderate assistance;Sitting/lateral leans;Sit to/from stand Lower Body Dressing Details (indicate cue type  and reason): Pt able to cross both BLEs, but pt unable to perform figure four technique without severe pain in low back when sitting upright. Pt reports that she denies need to see AE as pt's spouse will perform. Pt's spouse in room and verbal discussion of donning sore LE first.             Functional mobility during ADLs: Rolling walker;Min guard General ADL Comments: Pt presenting with limiting activity tolerance due to pain. Pt sitting in recliner and pain too severe to sit upright and tolerate figure 4 technique.     Vision   Vision Assessment?: No apparent visual deficits   Perception     Praxis      Cognition Arousal/Alertness: Awake/alert Behavior During Therapy: WFL for tasks assessed/performed;Anxious Overall Cognitive Status: Within Functional Limits for tasks assessed                                 General Comments: pt following all commands and does not appear to have problem solving deficits today. Pt followed all commands        Exercises     Shoulder Instructions       General Comments Spouse present, Pt continues to be anxious,  but following commands today.    Pertinent Vitals/ Pain       Pain Assessment: Faces Faces Pain Scale: Hurts even more Pain Location: back, abdomen Pain Descriptors / Indicators: Sore;Discomfort;Constant Pain Intervention(s): Monitored during session;Ice applied  Home Living                                          Prior Functioning/Environment              Frequency  Min 2X/week        Progress Toward Goals  OT Goals(current goals can now be found in the care plan section)  Progress towards OT goals: Progressing toward goals  Acute Rehab OT Goals Patient Stated Goal: decrease my pain OT Goal Formulation: With patient/family Time For Goal Achievement: 11/11/19 Potential to Achieve Goals: Good ADL Goals Pt Will Perform Upper Body Dressing: sitting;with set-up;with  supervision Pt Will Perform Lower Body Dressing: with set-up;with supervision;with caregiver independent in assisting;sit to/from stand;with adaptive equipment Pt Will Transfer to Toilet: ambulating;bedside commode;with set-up;with supervision Additional ADL Goal #1: Pt will perform bed mobility with Supervision in preparation for ADLs Additional ADL Goal #2: Pt will recall 3/3 back precautions independently  Plan Discharge plan remains appropriate    Co-evaluation                 AM-PAC OT "6 Clicks" Daily Activity     Outcome Measure   Help from another person eating meals?: None Help from another person taking care of personal grooming?: A Little Help from another person toileting, which includes using toliet, bedpan, or urinal?: A Little Help from another person bathing (including washing, rinsing, drying)?: A Lot Help from another person to put on and taking off regular upper body clothing?: A Little Help from another person to put on and taking off regular lower body clothing?: A Lot 6 Click Score: 17    End of Session Equipment Utilized During Treatment: Back brace;Rolling walker  OT Visit Diagnosis: Unsteadiness on feet (R26.81);Other abnormalities of gait and mobility (R26.89);Muscle weakness (generalized) (M62.81);Pain Pain - part of body: (back)   Activity Tolerance Patient tolerated treatment well;Patient limited by pain   Patient Left in chair;with call bell/phone within reach;with family/visitor present   Nurse Communication Mobility status;Precautions        Time: 0981-1914 OT Time Calculation (min): 14 min  Charges: OT General Charges $OT Visit: 1 Visit OT Treatments $Self Care/Home Management : 8-22 mins  Flora Lipps, OTR/L Acute Rehabilitation Services Pager: [phone] Office: [phone]    Talbot Monarch C 10/29/2019, 2:24 PM

## 2019-10-29 NOTE — Progress Notes (Signed)
  NEUROSURGERY PROGRESS NOTE   No issues overnight.  Pain improving, nausea resolved Feels better on feet when working with therapy  EXAM:  BP 138/75 (BP Location: Right Arm)   Pulse 87   Temp 98.4 F (36.9 C) (Oral)   Resp 20   Ht 5\' 6"  (1.676 m)   Wt 51.3 kg   SpO2 92%   BMI 18.24 kg/m   Awake, alert, oriented  Speech fluent, appropriate  CN grossly intact  MAEW Abd: soft, tender but nondistended Incisions: c/d/i  PLAN Doing well Continue pain control PT/OT

## 2019-10-29 NOTE — TOC Transition Note (Signed)
Transition of Care [facility]) - CM/SW Discharge Note   Patient Details  Name: Meagan Garrett MRN: [id number] Date of Birth: [birth year]  [facility] of [facility]) CM/SW Contact:  Deveron Furlong, RN 10/29/2019, 1:44 PM   Clinical Narrative:    Home health referral for PT/OT accepted by Becky Sax with [facility].      Barriers to Discharge: No Barriers Identified   Patient Goals and CMS Choice Patient states their goals for this hospitalization and ongoing recovery are:: "I just want to get back to being able to do everything I use to do." CMS Medicare.gov Compare Post Acute Care list provided to:: Patient Choice offered to / list presented to : Patient     Discharge Plan and Services     Post Acute Care Choice: Home Health                    HH Arranged: PT, OT [facility] Agency: [facility] Date [facility] Agency Contacted: 10/29/19      Social Determinants of Health (SDOH) Interventions     Readmission Risk Interventions No flowsheet data found.

## 2019-10-29 NOTE — TOC Initial Note (Addendum)
Transition of Care [facility]) - Initial/Assessment Note    Patient Details  Name: Meagan Garrett MRN: [id number] Date of Birth: [birth year]  Transition of Care [facility]) CM/SW Contact:    Oretha Milch, LCSW Phone Number: 10/29/2019, 12:48 PM  Clinical Narrative: CSW followed up with patient to discuss recommendation for home health services. Patient reported she was not aware of the recommendation but would be open per PT recommendation. 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 Patient requested [facility] is available. Patient reported she does have a 3-n-1 and a walker from her mother-in-law in the past they have available for her and do not need new equipment ordered. Patient reports she does live in a two story home but is able to stay on the first floor as they have a den she can use as a bedroom. Patient denied any SI/HI, substance use, or any relevant biopsychosocial factors that would impact discharge planning. CSW additionally noted patient denied the need for any additional services for her discharge. Patient denied having any further questions at this time.  CSW contacted [facility] to begin the referral process. CSW is awaiting return phone call from on-call supervisor to begin referral for home health services. CSW will update note as this progresses throughout the day.     1:10PM: CSW was informed by on-call supervisor at [facility] they do not provide home health as their nursing unit was bought out by [facility] "several years ago." Per [employer] is the provider for [place]. CSW reached out to [facility] and noted [facility] also provides services there. CSW notes RNCM is calling liaison with [facility] and will follow-up with CSW.       1:35PM: [facility] accepted referral for home health services.          Expected Discharge Plan: [place] Barriers to Discharge: No Barriers Identified   Patient Goals and CMS Choice Patient states their goals for this hospitalization and ongoing recovery are:: "I just want to get back to being able to do everything I use to do." CMS Medicare.gov Compare Post Acute Care list provided to:: Patient Choice offered to / list presented to : Patient  Expected Discharge Plan and Services Expected Discharge Plan: [place] Choice: [place] arrangements for the past 2 months: Single Family Home                           HH Arranged: PT, OT [place] Agency: [place] Date [place]: 10/29/19      Prior Living Arrangements/Services Living arrangements for the past 2 months: Single Family Home Lives with:: Spouse Patient language and need for interpreter reviewed:: Yes Do you feel safe going back to the place where you live?: Yes      Need for Family Participation in Patient Care: No (Comment) Care giver support system in place?: Yes (comment) Current home services: DME Criminal Activity/Legal Involvement Pertinent to Current Situation/Hospitalization: No - Comment as needed  Activities of Daily Living      Permission Sought/Granted Permission sought to share information with : Facility Art therapist granted to share information with : Yes, Verbal Permission Granted     Permission granted to share info w AGENCY: [place]  Permission granted to share info w Relationship: Provider     Emotional Assessment Appearance:: Appears stated age Attitude/Demeanor/Rapport: Self-Confident, Engaged Affect (typically observed): Calm, Appropriate Orientation: : Oriented to Self, Oriented to Place, Oriented to  Time, Oriented to Situation Alcohol / Substance Use: Not Applicable Psych Involvement: No (comment)  Admission diagnosis:  Lumbar scoliosis  [M41.9] Patient Active Problem List   Diagnosis Date Noted  . Lumbar scoliosis 10/27/2019   PCP:  Dorice Lamas, MD Pharmacy:   [facility] - [place], [place] - [address] [place] [address] Phone: [phone] Fax: [phone]     Social Determinants of Health (SDOH) Interventions    Readmission Risk Interventions No flowsheet data found.

## 2019-10-29 NOTE — Progress Notes (Signed)
Physical Therapy Treatment Patient Details Name: Meagan Garrett MRN: [id number] DOB: 06-02-1951 Today's Date: 10/29/2019    History of Present Illness 69 yo female s/p L5-S1 ALIF, R L1-L5 anterolateral lumbar fusion with pedicle screws L1-S1 on 10/28/19. PMH includes ACDF C5-6 2004, rapid HR, osteopenia, scoliosis, HTN, disc herniation.    PT Comments    Pt making good progress towards physical therapy goals today, requiring less assist for mobility and demonstrating improved activity tolerance. Ambulating 100 feet with a walker at a min guard assist level. Continues with abdominal and surgical pain. Education provided regarding car transfer technique. D/c plan remains appropriate.     Follow Up Recommendations  Home health PT;Supervision for mobility/OOB     Equipment Recommendations  3in1 (PT)    Recommendations for Other Services       Precautions / Restrictions Precautions Precautions: Fall;Back Precaution Booklet Issued: Yes (comment) Precaution Comments: Reviewed back precautions Required Braces or Orthoses: Spinal Brace Spinal Brace: Lumbar corset;Applied in sitting position Restrictions Weight Bearing Restrictions: No    Mobility  Bed Mobility Overal bed mobility: Needs Assistance Bed Mobility: Rolling;Sidelying to Sit Rolling: Supervision Sidelying to sit: Supervision       General bed mobility comments: Pt exiting towards left side of bed, good log roll technique. Increased time/effort but no physical assist required today  Transfers Overall transfer level: Needs assistance Equipment used: Rolling walker (2 wheeled) Transfers: Sit to/from Stand Sit to Stand: Min guard            Ambulation/Gait Ambulation/Gait assistance: Min guard Gait Distance (Feet): 100 Feet Assistive device: Rolling walker (2 wheeled) Gait Pattern/deviations: Step-through pattern;Decreased stride length;Trunk flexed Gait velocity: decreased   General Gait Details: Improved  pace, min guard for stability, increased bilateral knee flexion in midstance   Stairs             Wheelchair Mobility    Modified Rankin (Stroke Patients Only)       Balance Overall balance assessment: Needs assistance   Sitting balance-Leahy Scale: Fair     Standing balance support: Bilateral upper extremity supported Standing balance-Leahy Scale: Poor Standing balance comment: pt finally comfortable so pt wanting to defer standing today as she had just worked with PT.                            Cognition Arousal/Alertness: Awake/alert Behavior During Therapy: WFL for tasks assessed/performed;Anxious Overall Cognitive Status: Within Functional Limits for tasks assessed                                 General Comments: pt following all commands and does not appear to have problem solving deficits today. Pt followed all commands      Exercises      General Comments General comments (skin integrity, edema, etc.): Spouse present, Pt continues to be anxious, but following commands today.      Pertinent Vitals/Pain Pain Assessment: Faces Faces Pain Scale: Hurts even more Pain Location: back, abdomen Pain Descriptors / Indicators: Sore;Discomfort;Constant Pain Intervention(s): Monitored during session    Home Living                      Prior Function            PT Goals (current goals can now be found in the care plan section) Acute Rehab PT Goals Patient Stated Goal: decrease  my pain Potential to Achieve Goals: Good Progress towards PT goals: Progressing toward goals    Frequency    Min 5X/week      PT Plan Current plan remains appropriate    Co-evaluation              AM-PAC PT "6 Clicks" Mobility   Outcome Measure  Help needed turning from your back to your side while in a flat bed without using bedrails?: None Help needed moving from lying on your back to sitting on the side of a flat bed without  using bedrails?: None Help needed moving to and from a bed to a chair (including a wheelchair)?: A Little Help needed standing up from a chair using your arms (e.g., wheelchair or bedside chair)?: A Little Help needed to walk in hospital room?: A Little Help needed climbing 3-5 steps with a railing? : A Lot 6 Click Score: 19    End of Session Equipment Utilized During Treatment: Gait belt;Back brace Activity Tolerance: Patient tolerated treatment well Patient left: in chair;with call bell/phone within reach;with family/visitor present Nurse Communication: Mobility status PT Visit Diagnosis: Other abnormalities of gait and mobility (R26.89);Muscle weakness (generalized) (M62.81);Pain     Time: 1240-1305 PT Time Calculation (min) (ACUTE ONLY): 25 min  Charges:  $Therapeutic Activity: 23-37 mins                       Wyona Almas, PT, DPT Acute Rehabilitation Services Pager [phone] Office [phone]    Deno Etienne 10/29/2019, 4:58 PM

## 2019-10-29 NOTE — Progress Notes (Signed)
    Subjective  - POD #2  Complaining of nausea and abdominal tightness No flatus    Physical Exam:  Abdomen is soft but mildly tender to palpation Palpable pedal pulses       Assessment/Plan:  POD #2  Suspected postoperative ileus from anterior exposure.  The patient is unable to tolerate p.o. currently and so I will restart her back on gentle IV fluids.  I have encouraged her to ambulate as much as possible.  I will also repeat labs this morning.  Meagan Garrett 10/29/2019 7:45 AM --  Vitals:   10/29/19 0408 10/29/19 0740  BP: 133/88 138/75  Pulse: 85 87  Resp: 20 20  Temp: 98.4 F (36.9 C) 98.4 F (36.9 C)  SpO2: 98% 92%    Intake/Output Summary (Last 24 hours) at 10/29/2019 0745 Last data filed at 10/29/2019 0300 Gross per 24 hour  Intake 120 ml  Output 200 ml  Net -80 ml     Laboratory CBC    Component Value Date/Time   WBC 8.4 10/24/2019 1359   HGB 12.6 10/27/2019 1503   HCT 37.0 10/27/2019 1503   PLT 414 (H) 10/24/2019 1359    BMET    Component Value Date/Time   NA 139 10/27/2019 1503   K 3.4 (L) 10/27/2019 1503   CL 98 10/24/2019 1359   CO2 31 10/24/2019 1359   GLUCOSE 80 10/24/2019 1359   BUN 19 10/24/2019 1359   CREATININE 0.82 10/24/2019 1359   CALCIUM 10.4 (H) 10/24/2019 1359   GFRNONAA >60 10/24/2019 1359   GFRAA >60 10/24/2019 1359    COAG No results found for: INR, PROTIME No results found for: PTT  Antibiotics Anti-infectives (From admission, onward)   Start     Dose/Rate Route Frequency Ordered Stop   10/27/19 2200  ceFAZolin (ANCEF) IVPB 2g/100 mL premix     2 g 200 mL/hr over 30 Minutes Intravenous Every 8 hours 10/27/19 1825 10/28/19 0631   10/27/19 0600  ceFAZolin (ANCEF) IVPB 2g/100 mL premix     2 g 200 mL/hr over 30 Minutes Intravenous On call to O.R. 10/27/19 4970 10/27/19 1359       V. Charlena Cross, M.D., [facility] [place] Office: [phone] Pager:  [phone]

## 2019-10-30 MED ORDER — MAGNESIUM HYDROXIDE 400 MG/5ML PO SUSP
15.0000 mL | Freq: Every day | ORAL | Status: DC | PRN
  Filled 2019-10-30: qty 30

## 2019-10-30 NOTE — Progress Notes (Signed)
Physical Therapy Treatment Patient Details Name: Meagan Garrett MRN: [id number] DOB: 1950-12-27 Today's Date: 10/30/2019    History of Present Illness 69 yo female s/p L5-S1 ALIF, R L1-L5 anterolateral lumbar fusion with pedicle screws L1-S1 on 10/28/19. PMH includes ACDF C5-6 2004, rapid HR, osteopenia, scoliosis, HTN, disc herniation.    PT Comments    Pt more limited by right hip pain today, but still agreeable to participate. Ambulating 60 feet with a walker at a min guard assist level. Able to tolerate gentle left hamstring stretching with muscle energy technique in supine position but unable to tolerate any passive movement of right lower extremity. Ice applied post session. D/c plan remains appropriate.   Follow Up Recommendations  Home health PT;Supervision for mobility/OOB     Equipment Recommendations  3in1 (PT)    Recommendations for Other Services       Precautions / Restrictions Precautions Precautions: Fall;Back Precaution Booklet Issued: Yes (comment) Required Braces or Orthoses: Spinal Brace Spinal Brace: Lumbar corset;Applied in sitting position Restrictions Weight Bearing Restrictions: No    Mobility  Bed Mobility Overal bed mobility: Needs Assistance Bed Mobility: Sit to Sidelying;Rolling Rolling: Supervision       Sit to sidelying: Min assist General bed mobility comments: MinA for LE negotiation into bed due to pain  Transfers Overall transfer level: Needs assistance Equipment used: Rolling walker (2 wheeled) Transfers: Sit to/from Stand Sit to Stand: Min guard         General transfer comment: Cues for hand placement  Ambulation/Gait Ambulation/Gait assistance: Min guard Gait Distance (Feet): 60 Feet Assistive device: Rolling walker (2 wheeled) Gait Pattern/deviations: Step-through pattern;Decreased stride length;Trunk flexed;Decreased dorsiflexion - right;Decreased dorsiflexion - left Gait velocity: decreased   General Gait Details: No  gross unsteadiness, moderate reliance through arms on walker. Pt with increased knee flexion and decreased heel strike at initial contact; likely related to decreased flexibility   Stairs             Wheelchair Mobility    Modified Rankin (Stroke Patients Only)       Balance Overall balance assessment: Needs assistance   Sitting balance-Leahy Scale: Fair     Standing balance support: Bilateral upper extremity supported Standing balance-Leahy Scale: Poor                              Cognition Arousal/Alertness: Awake/alert Behavior During Therapy: WFL for tasks assessed/performed;Anxious Overall Cognitive Status: Within Functional Limits for tasks assessed                                        Exercises Other Exercises Other Exercises: Supine: LLE hamstring stretch with MET    General Comments        Pertinent Vitals/Pain Pain Assessment: Faces Faces Pain Scale: Hurts whole lot Pain Location: back, right hip Pain Descriptors / Indicators: Sore;Discomfort;Constant;Grimacing;Operative site guarding Pain Intervention(s): Limited activity within patient's tolerance;Monitored during session;Premedicated before session;Repositioned;Ice applied    Home Living                      Prior Function            PT Goals (current goals can now be found in the care plan section) Acute Rehab PT Goals Patient Stated Goal: decrease my pain Potential to Achieve Goals: Good    Frequency  Min 5X/week      PT Plan Current plan remains appropriate    Co-evaluation              AM-PAC PT "6 Clicks" Mobility   Outcome Measure  Help needed turning from your back to your side while in a flat bed without using bedrails?: None Help needed moving from lying on your back to sitting on the side of a flat bed without using bedrails?: None Help needed moving to and from a bed to a chair (including a wheelchair)?: A Little Help  needed standing up from a chair using your arms (e.g., wheelchair or bedside chair)?: A Little Help needed to walk in hospital room?: A Little Help needed climbing 3-5 steps with a railing? : A Lot 6 Click Score: 19    End of Session Equipment Utilized During Treatment: Gait belt;Back brace Activity Tolerance: Patient limited by pain Patient left: with call bell/phone within reach;with family/visitor present;in bed Nurse Communication: Mobility status PT Visit Diagnosis: Other abnormalities of gait and mobility (R26.89);Muscle weakness (generalized) (M62.81);Pain     Time: 2003-7944 PT Time Calculation (min) (ACUTE ONLY): 20 min  Charges:  $Therapeutic Activity: 8-22 mins                       Wyona Almas, PT, DPT Acute Rehabilitation Services Pager [phone] Office [phone]    Deno Etienne 10/30/2019, 1:18 PM

## 2019-10-30 NOTE — Progress Notes (Signed)
Patient ID: Meagan Garrett, female   DOB: [birth year], 69 y.o.   MRN: [id number] BP (!) 153/87 (BP Location: Left Arm)   Pulse 73   Temp (!) 97.4 F (36.3 C) (Oral)   Resp 20   Ht 5\' 6"  (1.676 m)   Wt 51.3 kg   SpO2 93%   BMI 18.24 kg/m  Alert and oriented x 4, speech is clear and fluent Wounds are clean, no signs of infection Moving lower extremities well Continue with therapy

## 2019-10-30 NOTE — Progress Notes (Signed)
    Subjective  - POD #3  Passed flatus, nausea improved C/o right flank pain and swelling and right leg discomfort   Physical Exam:  abd softer today Incision intact Palpable pedal pulses       Assessment/Plan:  POD #3  Post op ileus appears to be resolving.  Advance diet.  I will stop IV fluids.    Wells Abdurrahman Petersheim 10/30/2019 11:41 AM --  Vitals:   10/30/19 0502 10/30/19 0758  BP: 135/80 (!) 153/87  Pulse: 75 73  Resp:  20  Temp: 98.2 F (36.8 C) (!) 97.4 F (36.3 C)  SpO2: 90% 93%    Intake/Output Summary (Last 24 hours) at 10/30/2019 1141 Last data filed at 10/30/2019 0700 Gross per 24 hour  Intake 2047.93 ml  Output --  Net 2047.93 ml     Laboratory CBC    Component Value Date/Time   WBC 12.0 (H) 10/29/2019 0801   HGB 11.0 (L) 10/29/2019 0801   HCT 33.2 (L) 10/29/2019 0801   PLT 265 10/29/2019 0801    BMET    Component Value Date/Time   NA 138 10/29/2019 0801   K 4.3 10/29/2019 0801   CL 102 10/29/2019 0801   CO2 25 10/29/2019 0801   GLUCOSE 135 (H) 10/29/2019 0801   BUN 9 10/29/2019 0801   CREATININE 0.75 10/29/2019 0801   CALCIUM 9.4 10/29/2019 0801   GFRNONAA >60 10/29/2019 0801   GFRAA >60 10/29/2019 0801    COAG No results found for: INR, PROTIME No results found for: PTT  Antibiotics Anti-infectives (From admission, onward)   Start     Dose/Rate Route Frequency Ordered Stop   10/27/19 2200  ceFAZolin (ANCEF) IVPB 2g/100 mL premix     2 g 200 mL/hr over 30 Minutes Intravenous Every 8 hours 10/27/19 1825 10/28/19 0631   10/27/19 0600  ceFAZolin (ANCEF) IVPB 2g/100 mL premix     2 g 200 mL/hr over 30 Minutes Intravenous On call to O.R. 10/27/19 1749 10/27/19 1359       V. Charlena Cross, M.D., [facility] [place] Office: [phone] Pager:  [phone]

## 2019-10-31 ENCOUNTER — Encounter: Payer: Self-pay | Admitting: *Deleted

## 2019-10-31 MED ORDER — BISACODYL 10 MG RE SUPP
10.0000 mg | Freq: Once | RECTAL | Status: AC
  Administered 2019-10-31: 10 mg via RECTAL
  Filled 2019-10-31: qty 1

## 2019-10-31 NOTE — Progress Notes (Signed)
OT Cancellation Note  Patient Details Name: Meagan Garrett MRN: [id number] DOB: June 17, 1951   Cancelled Treatment:    Reason Eval/Treat Not Completed: Other (comment)(Pt reporting that she has been getting up to the bathroom several times in the last few hours and is very fatigued.)  Ugo Thoma M Holland Kotter Ayyan Sites MSOT, OTR/L Acute Rehab Pager: [phone] Office: [phone] 10/31/2019, 4:38 PM

## 2019-10-31 NOTE — Progress Notes (Addendum)
  Progress Note    10/31/2019 2:09 PM 4 Days Post-Op  Subjective:  Feeling better this afternoon. She has been able to have BM. Stomach feels better but she states from getting up and down to go to the bathroom her back is more sore. She expresses eagerness to go home soon   Vitals:   10/31/19 0754 10/31/19 1200  BP: 133/88 (!) 146/82  Pulse: (!) 108 91  Resp: 18 20  Temp: 98.4 F (36.9 C) 98.6 F (37 C)  SpO2: 97% 97%   Physical Exam: General: resting in bed, not in any distress Abdomen soft, non distended, minimal tenderness. Incision clean, dry and intact Extremities: palpable pedal pulses  CBC    Component Value Date/Time   WBC 12.0 (H) 10/29/2019 0801   RBC 3.14 (L) 10/29/2019 0801   HGB 11.0 (L) 10/29/2019 0801   HCT 33.2 (L) 10/29/2019 0801   PLT 265 10/29/2019 0801   MCV 105.7 (H) 10/29/2019 0801   MCH 35.0 (H) 10/29/2019 0801   MCHC 33.1 10/29/2019 0801   RDW 14.7 10/29/2019 0801    BMET    Component Value Date/Time   NA 138 10/29/2019 0801   K 4.3 10/29/2019 0801   CL 102 10/29/2019 0801   CO2 25 10/29/2019 0801   GLUCOSE 135 (H) 10/29/2019 0801   BUN 9 10/29/2019 0801   CREATININE 0.75 10/29/2019 0801   CALCIUM 9.4 10/29/2019 0801   GFRNONAA >60 10/29/2019 0801   GFRAA >60 10/29/2019 0801    INR No results found for: INR   Intake/Output Summary (Last 24 hours) at 10/31/2019 1409 Last data filed at 10/31/2019 0700 Gross per 24 hour  Intake 360 ml  Output --  Net 360 ml     Assessment/Plan:  69 y.o. female is s/p Anterior exposure L5-S1 4 Days Post-Op. Post op ileus resolving. She has been able to have BM today following dulcolax and stool softner. Still not tolerating much po intake- advance diet. IV was stopped. Continue mobilization    Graceann Congress, [place] [facility] [phone] 10/31/2019 2:09 PM   I agree with the above.  I will no longer see actively.  Please call with additional questions or  concerns  Wells Jarmarcus Wambold

## 2019-10-31 NOTE — Progress Notes (Addendum)
Subjective: Patient reports "I feel so much better than I did last night"  Objective: Vital signs in last 24 hours: Temp:  [97.4 F (36.3 C)-99 F (37.2 C)] 98.4 F (36.9 C) (03/15 0754) Pulse Rate:  [73-108] 108 (03/15 0754) Resp:  [18-20] 18 (03/15 0754) BP: (133-168)/(87-99) 133/88 (03/15 0754) SpO2:  [93 %-97 %] 97 % (03/15 0754)  Intake/Output from previous day: 03/14 0701 - 03/15 0700 In: 652 [P.O.:360; I.V.:292] Out: -  Intake/Output this shift: No intake/output data recorded.  Alert, conversant. Reports some lumbar pain with position chnages, much improved over weekend. Incisions left abdomen, right side and lumbar region without erythema, swelling or drainage beneath honeycomb and Dermabond drsgs. Good strength BLE. Belly soft, nontender. No BM yet, passing gas and belching. Advancing diet slowly.  Lab Results: Recent Labs    10/29/19 0801  WBC 12.0*  HGB 11.0*  HCT 33.2*  PLT 265   BMET Recent Labs    10/29/19 0801  NA 138  K 4.3  CL 102  CO2 25  GLUCOSE 135*  BUN 9  CREATININE 0.75  CALCIUM 9.4    Studies/Results: No results found.  Assessment/Plan: improving  LOS: 4 days  Continue to advance diet slowly. Mobilize in LSO.   PoteatArlys John 10/31/2019, 7:56 AM  Abdomen soft, passing gas, as of yet no BM.  Will give dulcolax suppository.  Continue to mobilize with PT.  Pain level in leg much improved from preop.

## 2019-10-31 NOTE — Progress Notes (Signed)
Physical Therapy Treatment Patient Details Name: Meagan Garrett MRN: [id number] DOB: 05-18-1951 Today's Date: 10/31/2019    History of Present Illness 69 yo female s/p L5-S1 ALIF, R L1-L5 anterolateral lumbar fusion with pedicle screws L1-S1 on 10/28/19. PMH includes ACDF C5-6 2004, rapid HR, osteopenia, scoliosis, HTN, disc herniation.    PT Comments    Patient progressing with ambulation distance and mobility in general.  Still significantly painful, but managing using ice and medication.  Spouse present and supportive and hopeful for d/c home tomorrow.  Remains appropriate for HHPT at d/c.     Follow Up Recommendations  Home health PT;Supervision for mobility/OOB     Equipment Recommendations  None recommended by PT    Recommendations for Other Services       Precautions / Restrictions Precautions Precautions: Fall;Back Required Braces or Orthoses: Spinal Brace Spinal Brace: Lumbar corset;Applied in sitting position Restrictions Other Position/Activity Restrictions: able to doff brace prior to supine with spouse assist    Mobility  Bed Mobility Overal bed mobility: Needs Assistance Bed Mobility: Sit to Sidelying;Rolling Rolling: Supervision Sidelying to sit: Supervision       General bed mobility comments: assist back to bed for positioning with pillows, etc  Transfers   Equipment used: Rolling walker (2 wheeled) Transfers: Sit to/from Stand Sit to Stand: Supervision;Min guard         General transfer comment: up from recliner assist to steady, up from toilet with S.  Ambulation/Gait Ambulation/Gait assistance: Min guard;Supervision Gait Distance (Feet): 110 Feet   Gait Pattern/deviations: Step-through pattern;Decreased stride length;Trunk flexed     General Gait Details: slow pace, shuffling, walker adjusted for height with some improved posture/hip extension with cues   Stairs             Wheelchair Mobility    Modified Rankin (Stroke  Patients Only)       Balance Overall balance assessment: Needs assistance Sitting-balance support: No upper extremity supported;Feet supported Sitting balance-Leahy Scale: Fair     Standing balance support: Bilateral upper extremity supported Standing balance-Leahy Scale: Good                              Cognition Arousal/Alertness: Awake/alert Behavior During Therapy: WFL for tasks assessed/performed Overall Cognitive Status: Within Functional Limits for tasks assessed                                        Exercises      General Comments General comments (skin integrity, edema, etc.): Spouse present and assisting, able to toilet with S, RN aware +BM      Pertinent Vitals/Pain Pain Score: 7  Pain Location: back, right hip/incisional Pain Descriptors / Indicators: Discomfort;Grimacing;Operative site guarding Pain Intervention(s): Monitored during session;Repositioned;Ice applied    Home Living                      Prior Function            PT Goals (current goals can now be found in the care plan section) Progress towards PT goals: Progressing toward goals    Frequency    Min 5X/week      PT Plan Current plan remains appropriate    Co-evaluation              AM-PAC PT "6 Clicks" Mobility   Outcome  Measure  Help needed turning from your back to your side while in a flat bed without using bedrails?: None Help needed moving from lying on your back to sitting on the side of a flat bed without using bedrails?: None Help needed moving to and from a bed to a chair (including a wheelchair)?: A Little Help needed standing up from a chair using your arms (e.g., wheelchair or bedside chair)?: A Little Help needed to walk in hospital room?: A Little Help needed climbing 3-5 steps with a railing? : A Little 6 Click Score: 20    End of Session Equipment Utilized During Treatment: Back brace Activity Tolerance: Patient  tolerated treatment well Patient left: in bed;with call bell/phone within reach;with family/visitor present   PT Visit Diagnosis: Other abnormalities of gait and mobility (R26.89);Muscle weakness (generalized) (M62.81);Pain Pain - part of body: (back)     Time: 4353-9122 PT Time Calculation (min) (ACUTE ONLY): 27 min  Charges:  $Gait Training: 8-22 mins $Therapeutic Activity: 8-22 mins                     [name], [facility] Acute Rehabilitation Services [phone] 10/31/2019    Meagan Garrett 10/31/2019, 11:46 AM

## 2019-11-01 MED ORDER — METOPROLOL TARTRATE 5 MG/5ML IV SOLN
5.0000 mg | INTRAVENOUS | Status: DC | PRN
  Administered 2019-11-01: 5 mg via INTRAVENOUS
  Filled 2019-11-01: qty 5

## 2019-11-01 NOTE — Progress Notes (Addendum)
Subjective: Patient reports "I'm ok. I overdid it going back and forth to the bathroom"  Objective: Vital signs in last 24 hours: Temp:  [97.9 F (36.6 C)-99 F (37.2 C)] 98.8 F (37.1 C) (03/16 0403) Pulse Rate:  [86-113] 86 (03/16 0403) Resp:  [20] 20 (03/15 1200) BP: (146-175)/(82-91) 156/90 (03/16 0403) SpO2:  [97 %-99 %] 99 % (03/15 1631)  Intake/Output from previous day: 03/15 0701 - 03/16 0700 In: 840 [P.O.:840] Out: -  Intake/Output this shift: Total I/O In: 400 [P.O.:400] Out: -   Alert, conversant, smiling. Lumbar soreness with increased ambulation yesterday and overnight. Incisions flat without erythema or drianage. Belly soft, nontender.   Lab Results: No results for input(s): WBC, HGB, HCT, PLT in the last 72 hours. BMET No results for input(s): NA, K, CL, CO2, GLUCOSE, BUN, CREATININE, CALCIUM in the last 72 hours.  Studies/Results: No results found.  Assessment/Plan: improving  LOS: 5 days  Will work with therapies this am and continue to advance diet as tolerated.   Georgiann Cocker 11/01/2019, 9:13 AM    Patient is progressing well.  Anticipate discharge in AM.

## 2019-11-01 NOTE — Progress Notes (Signed)
At 1742, patient's blood pressure was 187/102. Patient complained of slight frontal headache as well. This was directly after patient had walked to the bathroom and back. Dr. Fredrich Birks office was called, and Dr. Maisie Fus returned the page, ordering 5mg  IV Lopressor q4 hours for SBP over 160. He also ordered to go ahead and give her IV Dilaudid for pain control to assist in bringing bp down.

## 2019-11-01 NOTE — Progress Notes (Signed)
Occupational Therapy Treatment Patient Details Name: Meagan Garrett MRN: [id number] DOB: Nov 05, 1950 Today's Date: 11/01/2019    History of present illness 69 yo female s/p L5-S1 ALIF, R L1-L5 anterolateral lumbar fusion with pedicle screws L1-S1 on 10/28/19. PMH includes ACDF C5-6 2004, rapid HR, osteopenia, scoliosis, HTN, disc herniation.   OT comments  Pt progressing towards established OT goals. Continues to present with generalized weakness and fatigue. Pt performing grooming at sink and functional mobility in hallway with Supervision and RW. Pt demonstrating understanding of log roll techniques for bed mobility (declining to lower HOB at this time) and donning/doffing of brace with Supervision. Pt confirming that her husband will assist with all bathing and dressing activities for adherence to back precautions. Reviewed all education and answered all questions in preparation for possible dc home today or tomorrow (per pt). All acute OT needs met and will sign off. Continue to recommend dc to home once medically stable per physician.    Follow Up Recommendations  No OT follow up    Equipment Recommendations  3 in 1 bedside commode    Recommendations for Other Services PT consult    Precautions / Restrictions Precautions Precautions: Fall;Back Precaution Booklet Issued: Yes (comment) Precaution Comments: Reviewed back precautions Required Braces or Orthoses: Spinal Brace Spinal Brace: Lumbar corset;Applied in sitting position Restrictions Weight Bearing Restrictions: No Other Position/Activity Restrictions: able to doff brace prior to supine with spouse assist       Mobility Bed Mobility Overal bed mobility: Needs Assistance Bed Mobility: Sit to Sidelying;Rolling;Sidelying to Sit Rolling: Supervision Sidelying to sit: Supervision;HOB elevated     Sit to sidelying: Supervision;HOB elevated General bed mobility comments: Supervision for safety. Pt declining to lower  HOB  Transfers Overall transfer level: Needs assistance Equipment used: Rolling walker (2 wheeled) Transfers: Sit to/from Stand Sit to Stand: Supervision;Min guard         General transfer comment: Supervision for safety    Balance Overall balance assessment: Needs assistance Sitting-balance support: No upper extremity supported;Feet supported Sitting balance-Leahy Scale: Good     Standing balance support: Single extremity supported;During functional activity Standing balance-Leahy Scale: Good                             ADL either performed or assessed with clinical judgement   ADL Overall ADL's : Needs assistance/impaired     Grooming: Supervision/safety;Brushing hair;Standing Grooming Details (indicate cue type and reason): Supervision while standing at sink to bruhs hair.         Upper Body Dressing : Supervision/safety;Sitting Upper Body Dressing Details (indicate cue type and reason): Supervision for doninng/doffing brace   Lower Body Dressing Details (indicate cue type and reason): Pt confirming that husband planning on assists with all LB ADLs. Toilet Transfer: Supervision/safety;Ambulation;RW(simulated in room)           Functional mobility during ADLs: Rolling walker;Supervision/safety General ADL Comments: Reviewed all education on compensatory techniques for ADLs and funcitonal trnasfers for adherance to back precautions. Pt confirms that her husband "will help with everything"     Vision   Vision Assessment?: No apparent visual deficits   Perception     Praxis      Cognition Arousal/Alertness: Awake/alert Behavior During Therapy: WFL for tasks assessed/performed Overall Cognitive Status: Within Functional Limits for tasks assessed  General Comments: Slight self limiting behaviors        Exercises     Shoulder Instructions       General Comments VSS    Pertinent Vitals/  Pain       Pain Assessment: Faces Faces Pain Scale: Hurts whole lot Pain Location: back, right hip/incisional Pain Descriptors / Indicators: Discomfort;Grimacing;Operative site guarding Pain Intervention(s): Monitored during session;Limited activity within patient's tolerance;Repositioned  Home Living                                          Prior Functioning/Environment              Frequency  Min 2X/week        Progress Toward Goals  OT Goals(current goals can now be found in the care plan section)  Progress towards OT goals: Progressing toward goals;Goals met/education completed, patient discharged from OT  Acute Rehab OT Goals Patient Stated Goal: decrease my pain OT Goal Formulation: With patient/family Time For Goal Achievement: 11/11/19 Potential to Achieve Goals: Good ADL Goals Pt Will Perform Upper Body Dressing: sitting;with set-up;with supervision Pt Will Perform Lower Body Dressing: with set-up;with supervision;with caregiver independent in assisting;sit to/from stand;with adaptive equipment Pt Will Transfer to Toilet: ambulating;bedside commode;with set-up;with supervision Additional ADL Goal #1: Pt will perform bed mobility with Supervision in preparation for ADLs Additional ADL Goal #2: Pt will recall 3/3 back precautions independently  Plan Discharge plan remains appropriate;All goals met and education completed, patient discharged from OT services    Co-evaluation                 AM-PAC OT "6 Clicks" Daily Activity     Outcome Measure   Help from another person eating meals?: None Help from another person taking care of personal grooming?: A Little Help from another person toileting, which includes using toliet, bedpan, or urinal?: A Little Help from another person bathing (including washing, rinsing, drying)?: A Lot Help from another person to put on and taking off regular upper body clothing?: A Little Help from another  person to put on and taking off regular lower body clothing?: A Lot 6 Click Score: 17    End of Session Equipment Utilized During Treatment: Back brace;Rolling walker  OT Visit Diagnosis: Unsteadiness on feet (R26.81);Other abnormalities of gait and mobility (R26.89);Muscle weakness (generalized) (M62.81);Pain Pain - part of body: (back)   Activity Tolerance Patient tolerated treatment well;Patient limited by pain   Patient Left in bed;with call bell/phone within reach   Nurse Communication Mobility status;Precautions        Time: 0076-2263 OT Time Calculation (min): 13 min  Charges: OT General Charges $OT Visit: 1 Visit OT Treatments $Self Care/Home Management : 8-22 mins  [place], OTR/L Acute Rehab Pager: [phone] Office: [place] 11/01/2019, 9:48 AM

## 2019-11-01 NOTE — Progress Notes (Signed)
Physical Therapy Treatment Patient Details Name: Meagan Garrett MRN: [id number] DOB: 11-29-1950 Today's Date: 11/01/2019    History of Present Illness 69 yo female s/p L5-S1 ALIF, R L1-L5 anterolateral lumbar fusion with pedicle screws L1-S1 on 10/28/19. PMH includes ACDF C5-6 2004, rapid HR, osteopenia, scoliosis, HTN, disc herniation.    PT Comments    Patient progressing with mobility slowly due to continued pain and up more frequently for toileting.  She is improving independence with donning brace and bed mobility, but still limited with gait distance.  PT to follow acutely.   Follow Up Recommendations  Home health PT;Supervision for mobility/OOB     Equipment Recommendations  None recommended by PT    Recommendations for Other Services       Precautions / Restrictions Precautions Precautions: Fall;Back Required Braces or Orthoses: Spinal Brace Spinal Brace: Lumbar corset;Applied in sitting position    Mobility  Bed Mobility     Rolling: Supervision Sidelying to sit: Supervision;HOB elevated     Sit to sidelying: Supervision;HOB elevated    Transfers Overall transfer level: Needs assistance Equipment used: Rolling walker (2 wheeled) Transfers: Sit to/from Stand Sit to Stand: Supervision;Min guard         General transfer comment: Supervision for safety  Ambulation/Gait Ambulation/Gait assistance: Min guard;Supervision Gait Distance (Feet): 110 Feet Assistive device: Rolling walker (2 wheeled) Gait Pattern/deviations: Step-through pattern;Decreased stride length;Trunk flexed     General Gait Details: cues for posture, encouraged distance, but not tolerated   Stairs             Wheelchair Mobility    Modified Rankin (Stroke Patients Only)       Balance Overall balance assessment: Needs assistance Sitting-balance support: No upper extremity supported;Feet supported Sitting balance-Leahy Scale: Good     Standing balance support: No upper  extremity supported Standing balance-Leahy Scale: Fair Standing balance comment: able to don panties in standint without UE support                            Cognition Arousal/Alertness: Awake/alert Behavior During Therapy: WFL for tasks assessed/performed Overall Cognitive Status: Within Functional Limits for tasks assessed                                        Exercises      General Comments General comments (skin integrity, edema, etc.): spouse present and supportive      Pertinent Vitals/Pain Pain Score: 7  Pain Location: back, right hip/incisional with ambulation Pain Descriptors / Indicators: Discomfort;Grimacing;Operative site guarding Pain Intervention(s): Monitored during session;Repositioned;Ice applied    Home Living                      Prior Function            PT Goals (current goals can now be found in the care plan section) Progress towards PT goals: Progressing toward goals    Frequency    Min 5X/week      PT Plan Current plan remains appropriate    Co-evaluation              AM-PAC PT "6 Clicks" Mobility   Outcome Measure  Help needed turning from your back to your side while in a flat bed without using bedrails?: None Help needed moving from lying on your back to sitting on the  side of a flat bed without using bedrails?: None Help needed moving to and from a bed to a chair (including a wheelchair)?: A Little Help needed standing up from a chair using your arms (e.g., wheelchair or bedside chair)?: A Little Help needed to walk in hospital room?: A Little Help needed climbing 3-5 steps with a railing? : A Little 6 Click Score: 20    End of Session Equipment Utilized During Treatment: Back brace Activity Tolerance: Patient tolerated treatment well Patient left: in bed;with call bell/phone within reach;with family/visitor present   PT Visit Diagnosis: Other abnormalities of gait and mobility  (R26.89);Muscle weakness (generalized) (M62.81);Pain     Time: 8473-0856 PT Time Calculation (min) (ACUTE ONLY): 23 min  Charges:  $Gait Training: 8-22 mins $Therapeutic Activity: 8-22 mins                     Meagan Garrett, [facility] [phone] 11/01/2019    Meagan Garrett 11/01/2019, 5:12 PM

## 2019-11-02 MED ORDER — OXYCODONE HCL 5 MG PO TABS: 5.0000 mg | ORAL_TABLET | ORAL | 0 refills | Status: AC | PRN

## 2019-11-02 MED ORDER — METHOCARBAMOL 500 MG PO TABS: 500.0000 mg | ORAL_TABLET | Freq: Four times a day (QID) | ORAL | 1 refills | Status: AC | PRN

## 2019-11-02 NOTE — Progress Notes (Addendum)
Subjective: Patient reports "I hurt whenever I walk. They had to give me extra blood pressure medicine last night"  Objective: Vital signs in last 24 hours: Temp:  [97.4 F (36.3 C)-99.2 F (37.3 C)] 97.9 F (36.6 C) (03/17 0745) Pulse Rate:  [84-105] 88 (03/17 0745) Resp:  [16-18] 18 (03/17 0745) BP: (133-187)/(77-102) 147/92 (03/17 0745) SpO2:  [95 %-99 %] 97 % (03/17 0745)  Intake/Output from previous day: 03/16 0701 - 03/17 0700 In: 700 [P.O.:700] Out: -  Intake/Output this shift: No intake/output data recorded.  Alert, conversant. Anxious re: episode of headache and elevated BP after walking to bathroom and experiencing increased back pain last night. No further episodes documented after IV Lopressor ad rest. Good strength BLE. Belly soft, nontender. Passing gas.   Lab Results: No results for input(s): WBC, HGB, HCT, PLT in the last 72 hours. BMET No results for input(s): NA, K, CL, CO2, GLUCOSE, BUN, CREATININE, CALCIUM in the last 72 hours.  Studies/Results: No results found.  Assessment/Plan: improving  LOS: 6 days  Continue to mobilize in LSO. Reassured re: perioperative expectations.  Will work with PT more today.   Georgiann Cocker 11/02/2019, 8:12 AM    Patient is making steady progress.  Anticipate discharge later today after more PT.

## 2019-11-02 NOTE — Discharge Summary (Signed)
Physician Discharge Summary  Patient ID: Meagan Garrett MRN: [id number] DOB/AGE: 01/18/1951 69 y.o.  Admit date: 10/27/2019 Discharge date: 11/02/2019  Admission Diagnoses:  Discharge Diagnoses:  Active Problems:   Lumbar scoliosis   Discharged Condition: good  Hospital Course: Meagan Garrett was admitted for surgery with dx scoliosis, stenosis, and radiculopathy. Following uncomplicated surgery (above), she recovered well and transferred to [facility] for nursing care and therapies. She mobilized slowly due to pain and constipation, but progressed steadily.   Consults: vascular surgery  Significant Diagnostic Studies: radiology: X-Ray: intra-op  Treatments: surgery: Lumbar Five Sacral One Anterior lumbar interbody fusion (N/A) - Anterior exposure, Part-1 Right Lumbar One-Two Lumbar Two-Three Lumbar Three-Four Lumbar Four-Five Anterolateral lumbar fusion with percutaneous pedicle screws Lumbar 1 to Sacral 1 (Right) - Anterolateral, Part-2 LUMBAR PERCUTANEOUS PEDICLE SCREW LUMBAR ONE-SACRAL ONE. (N/A) - posterior, Part-3 ABDOMINAL EXPOSURE (N/A) - anterior , Part-1    Discharge Exam: Blood pressure (!) 147/92, pulse 88, temperature 97.9 F (36.6 C), resp. rate 18, height 5\' 6"  (1.676 m), weight 51.3 kg, SpO2 97 %. Alert, conversant. Anxious re: episode of headache and elevated BP after walking to bathroom and experiencing increased back pain last night. No further episodes documented after IV Lopressor and rest. Good strength BLE. Belly soft, nontender. Passing gas.    Disposition: Discharge to home following PT today. Pain medications and muscle relaxer will be eRx'ed for prn home use. Office f/u in 3 weeks.   Discharge Instructions    Diet - low sodium heart healthy   Complete by: As directed    Increase activity slowly   Complete by: As directed      Allergies as of 11/02/2019   No Known Allergies     Medication List    TAKE these medications   B-12 2500 MCG  Tabs Take 2,500 mcg by mouth daily.   calcium carbonate 1500 (600 Ca) MG Tabs tablet Commonly known as: OSCAL Take 600 mg of elemental calcium by mouth 2 (two) times daily with a meal.   gabapentin 300 MG capsule Commonly known as: NEURONTIN Take 300 mg by mouth 3 (three) times daily.   hydrochlorothiazide 12.5 MG capsule Commonly known as: MICROZIDE Take 12.5 mg by mouth daily as needed (fluid or edema).   HYDROcodone-acetaminophen 10-325 MG tablet Commonly known as: NORCO Take 1 tablet by mouth every 4 (four) hours as needed for moderate pain.   methocarbamol 500 MG tablet Commonly known as: ROBAXIN Take 1 tablet (500 mg total) by mouth every 6 (six) hours as needed for muscle spasms.   metoprolol tartrate 25 MG tablet Commonly known as: LOPRESSOR Take 12.5-25 mg by mouth See admin instructions. Take 12.5 mg by mouth in the morning and 25 mg in the evening   multivitamin with minerals Tabs tablet Take 1 tablet by mouth daily.   oxyCODONE 5 MG immediate release tablet Commonly known as: Oxy IR/ROXICODONE Take 1-2 tablets (5-10 mg total) by mouth every 3 (three) hours as needed for severe pain ((score 4 to 6)).   potassium chloride 10 MEQ tablet Commonly known as: KLOR-CON Take 10 mEq by mouth daily.      Follow-up Information    09-17-1984, MD Follow up.   Specialty: Neurosurgery Contact information: [address] [place] [phone]           Signed: [phone], MD 11/02/2019, 8:52 AM

## 2019-11-02 NOTE — TOC Transition Note (Signed)
Transition of Care [facility]) - CM/SW Discharge Note   Patient Details  Name: Lynnda Wiersma MRN: [id number] Date of Birth: February 07, 1951  Transition of Care [facility]) CM/SW Contact:  Lawerance Sabal, RN Phone Number: 11/02/2019, 10:46 AM   Clinical Narrative:    Notified [facility] of DC. Patient has all DME at home already    Final next level of care: Home w Home Health Services Barriers to Discharge: No Barriers Identified   Patient Goals and CMS Choice Patient states their goals for this hospitalization and ongoing recovery are:: "I just want to get back to being able to do everything I use to do." CMS Medicare.gov Compare Post Acute Care list provided to:: Patient Choice offered to / list presented to : Patient  Discharge Placement                       Discharge Plan and Services     Post Acute Care Choice: Home Health          DME Arranged: N/A         HH Arranged: OT, PT HH Agency: [facility] Date [facility] Agency Contacted: 11/02/19 Time HH Agency Contacted: 1046 Representative spoke with at [facility] Agency: Elnita Maxwell  Social Determinants of Health (SDOH) Interventions     Readmission Risk Interventions No flowsheet data found.

## 2019-11-02 NOTE — Progress Notes (Signed)
Physical Therapy Treatment Patient Details Name: Meagan Garrett MRN: [id number] DOB: 07/08/1951 Today's Date: 11/02/2019    History of Present Illness 69 yo female s/p L5-S1 ALIF, R L1-L5 anterolateral lumbar fusion with pedicle screws L1-S1 on 10/28/19. PMH includes ACDF C5-6 2004, rapid HR, osteopenia, scoliosis, HTN, disc herniation.    PT Comments    Pt making good progress towards her physical therapy goals. Seems to have improved pain control and increased activity tolerance today. Ambulating household distances, a distance of 150 feet with a walker at a supervision level. Reinforced education regarding activity recommendations and progression and continued pain management techniques I.e. cryotherapy. Recommend d/c with HHPT to maximize functional independence.    Follow Up Recommendations  Home health PT;Supervision for mobility/OOB     Equipment Recommendations  None recommended by PT    Recommendations for Other Services       Precautions / Restrictions Precautions Precautions: Fall;Back Precaution Booklet Issued: Yes (comment) Required Braces or Orthoses: Spinal Brace Spinal Brace: Lumbar corset;Applied in sitting position Restrictions Weight Bearing Restrictions: No    Mobility  Bed Mobility Overal bed mobility: Modified Independent Bed Mobility: Sidelying to Sit;Rolling;Sit to Sidelying              Transfers Overall transfer level: Needs assistance Equipment used: Rolling walker (2 wheeled) Transfers: Sit to/from Stand Sit to Stand: Supervision         General transfer comment: cues for hand positioning  Ambulation/Gait Ambulation/Gait assistance: Supervision Gait Distance (Feet): 150 Feet Assistive device: Rolling walker (2 wheeled) Gait Pattern/deviations: Step-through pattern;Decreased stride length;Trunk flexed;Decreased dorsiflexion - right;Decreased dorsiflexion - left Gait velocity: decreased   General Gait Details: Slow, steady pace.  Decreased bilateral heel strike at initial contact   Stairs             Wheelchair Mobility    Modified Rankin (Stroke Patients Only)       Balance Overall balance assessment: Needs assistance Sitting-balance support: No upper extremity supported;Feet supported Sitting balance-Leahy Scale: Good     Standing balance support: No upper extremity supported Standing balance-Leahy Scale: Fair Standing balance comment: able to don panties in standing without UE support                            Cognition Arousal/Alertness: Awake/alert Behavior During Therapy: WFL for tasks assessed/performed Overall Cognitive Status: Within Functional Limits for tasks assessed                                        Exercises General Exercises - Lower Extremity Long Arc Quad: Both;10 reps;Seated    General Comments        Pertinent Vitals/Pain Pain Assessment: Faces Faces Pain Scale: Hurts even more Pain Location: back, right hip/incisional with ambulation Pain Descriptors / Indicators: Discomfort;Grimacing;Operative site guarding Pain Intervention(s): Limited activity within patient's tolerance;Monitored during session;Premedicated before session    Home Living                      Prior Function            PT Goals (current goals can now be found in the care plan section) Acute Rehab PT Goals Patient Stated Goal: decrease my pain Potential to Achieve Goals: Good Progress towards PT goals: Progressing toward goals    Frequency    Min 5X/week  PT Plan Current plan remains appropriate    Co-evaluation              AM-PAC PT "6 Clicks" Mobility   Outcome Measure  Help needed turning from your back to your side while in a flat bed without using bedrails?: None Help needed moving from lying on your back to sitting on the side of a flat bed without using bedrails?: None Help needed moving to and from a bed to a chair  (including a wheelchair)?: None Help needed standing up from a chair using your arms (e.g., wheelchair or bedside chair)?: None Help needed to walk in hospital room?: None Help needed climbing 3-5 steps with a railing? : A Little 6 Click Score: 23    End of Session Equipment Utilized During Treatment: Back brace Activity Tolerance: Patient tolerated treatment well Patient left: in bed;with call bell/phone within reach Nurse Communication: Mobility status PT Visit Diagnosis: Other abnormalities of gait and mobility (R26.89);Muscle weakness (generalized) (M62.81);Pain     Time: 0822-0851 PT Time Calculation (min) (ACUTE ONLY): 29 min  Charges:  $Therapeutic Activity: 23-37 mins                       Lillia Pauls, PT, DPT Acute Rehabilitation Services Pager [phone] Office [phone]    [name] 11/02/2019, 9:00 AM

## 2019-11-02 NOTE — Progress Notes (Signed)
Patient set for discharge to home with home health. Patient stated that she already had a walker, cane, and 3n1 at home. IV was removed, all belongings were packed, and discharge paperwork was reviewed with patient and her husband. The patient was escorted out in a wheelchair.

## 2020-12-07 IMAGING — CR DG OR LOCAL ABDOMEN
1 series · 1 of 1 positions shown · non-contrast
Comparison: None.

CLINICAL DATA: Evaluate for retained instruments. Status post
anterior and interbody fusion at L5-S1.

EXAM:
OR LOCAL ABDOMEN

[AP]
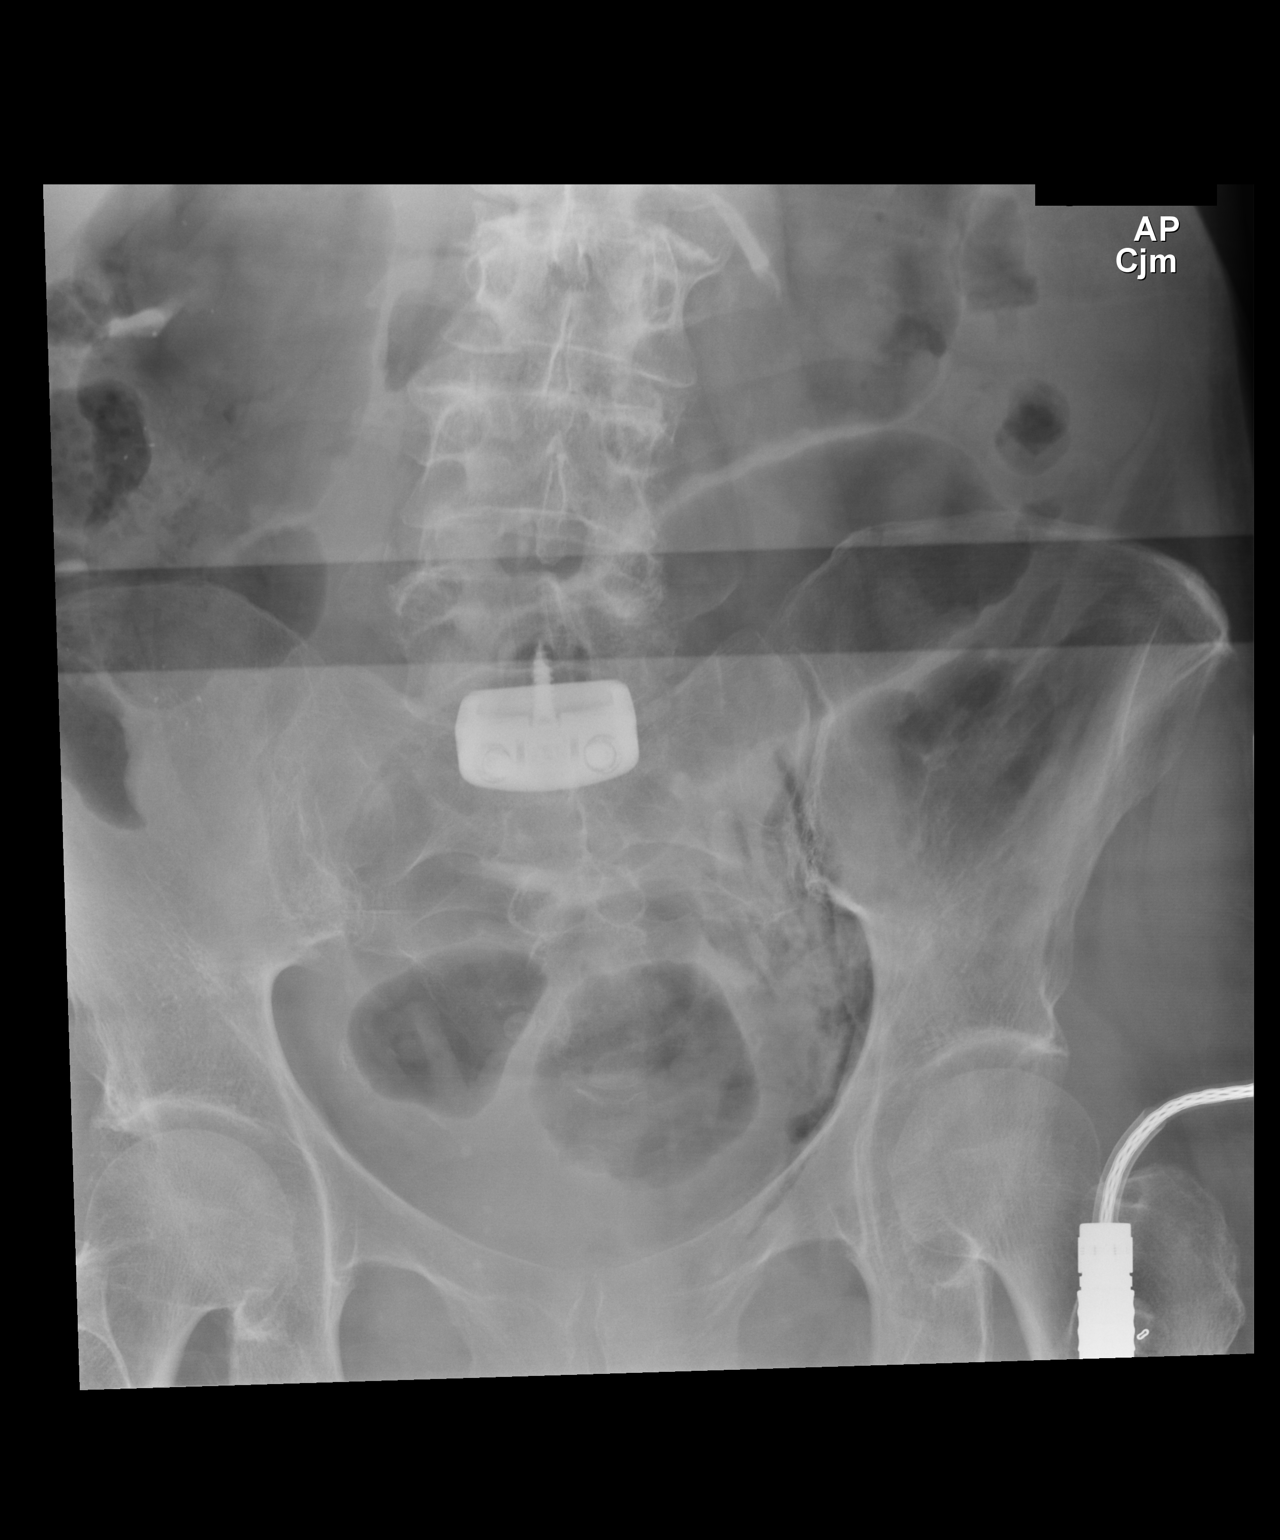

[1 of 1 positions shown; findings below may reference images not displayed]

FINDINGS: No unexpected radiopaque foreign bodies are identified. And
interbody fusion devices in place at L5-S1.
IMPRESSION: No unexpected radiopaque foreign bodies.
